# Patient Record
Sex: Female | Born: 1995 | Race: Asian | Hispanic: No | Marital: Single | Smoking: Former smoker
Health system: Southern US, Community
[De-identification: ages and names within clinical notes are randomized; demographics above are authoritative.]

## PROBLEM LIST (undated history)

## (undated) DIAGNOSIS — Z789 Other specified health status: Secondary | ICD-10-CM

## (undated) DIAGNOSIS — F32A Depression, unspecified: Secondary | ICD-10-CM

## (undated) HISTORY — PX: NO PAST SURGERIES: SHX2092

## (undated) HISTORY — DX: Depression, unspecified: F32.A

## (undated) HISTORY — DX: Other specified health status: Z78.9

---

## 2020-10-01 ENCOUNTER — Other Ambulatory Visit: Payer: Self-pay

## 2020-10-01 ENCOUNTER — Ambulatory Visit (INDEPENDENT_AMBULATORY_CARE_PROVIDER_SITE_OTHER): Payer: 59

## 2020-10-01 VITALS — BP 107/52 | HR 83 | Wt 143.0 lb

## 2020-10-01 DIAGNOSIS — Z3201 Encounter for pregnancy test, result positive: Secondary | ICD-10-CM

## 2020-10-01 LAB — POCT URINE PREGNANCY: Preg Test, Ur: NEGATIVE

## 2020-10-01 NOTE — Progress Notes (Addendum)
Pt presents for UPT. UPT positive. Pt will f/u at [facility] appt.  Jasmine Buchanan l Jasmine Buchanan, CMA   Attestation of Attending Supervision of CMA/RN: Evaluation and management procedures were performed by the nurse under my supervision and collaboration.  I have reviewed the nursing note and chart, and I agree with the management and plan.  Carolyn L. Harraway-Smith, M.D., [place]

## 2020-10-15 DIAGNOSIS — Z3401 Encounter for supervision of normal first pregnancy, first trimester: Secondary | ICD-10-CM | POA: Diagnosis not present

## 2020-10-15 DIAGNOSIS — B373 Candidiasis of vulva and vagina: Secondary | ICD-10-CM | POA: Diagnosis not present

## 2020-10-15 DIAGNOSIS — Z23 Encounter for immunization: Secondary | ICD-10-CM | POA: Diagnosis not present

## 2020-10-19 DIAGNOSIS — Z3A13 13 weeks gestation of pregnancy: Secondary | ICD-10-CM | POA: Diagnosis not present

## 2020-10-19 DIAGNOSIS — Z3689 Encounter for other specified antenatal screening: Secondary | ICD-10-CM | POA: Diagnosis not present

## 2020-10-23 ENCOUNTER — Encounter: Payer: Medicaid Other | Admitting: Advanced Practice Midwife

## 2021-01-09 DIAGNOSIS — O365920 Maternal care for other known or suspected poor fetal growth, second trimester, not applicable or unspecified: Secondary | ICD-10-CM | POA: Diagnosis not present

## 2021-01-09 DIAGNOSIS — Z3A25 25 weeks gestation of pregnancy: Secondary | ICD-10-CM | POA: Diagnosis not present

## 2021-02-04 DIAGNOSIS — Z3403 Encounter for supervision of normal first pregnancy, third trimester: Secondary | ICD-10-CM | POA: Diagnosis not present

## 2021-02-04 DIAGNOSIS — Z23 Encounter for immunization: Secondary | ICD-10-CM | POA: Diagnosis not present

## 2021-02-04 DIAGNOSIS — B373 Candidiasis of vulva and vagina: Secondary | ICD-10-CM | POA: Diagnosis not present

## 2021-02-04 DIAGNOSIS — R109 Unspecified abdominal pain: Secondary | ICD-10-CM | POA: Diagnosis not present

## 2021-02-07 DIAGNOSIS — O365930 Maternal care for other known or suspected poor fetal growth, third trimester, not applicable or unspecified: Secondary | ICD-10-CM | POA: Diagnosis not present

## 2021-02-07 DIAGNOSIS — Z3A29 29 weeks gestation of pregnancy: Secondary | ICD-10-CM | POA: Diagnosis not present

## 2021-03-01 DIAGNOSIS — Z3A32 32 weeks gestation of pregnancy: Secondary | ICD-10-CM | POA: Diagnosis not present

## 2021-03-01 DIAGNOSIS — Z3689 Encounter for other specified antenatal screening: Secondary | ICD-10-CM | POA: Diagnosis not present

## 2021-03-01 DIAGNOSIS — O365930 Maternal care for other known or suspected poor fetal growth, third trimester, not applicable or unspecified: Secondary | ICD-10-CM | POA: Diagnosis not present

## 2021-03-08 DIAGNOSIS — Z3A33 33 weeks gestation of pregnancy: Secondary | ICD-10-CM | POA: Diagnosis not present

## 2021-03-08 DIAGNOSIS — O365930 Maternal care for other known or suspected poor fetal growth, third trimester, not applicable or unspecified: Secondary | ICD-10-CM | POA: Diagnosis not present

## 2021-03-08 DIAGNOSIS — Z3689 Encounter for other specified antenatal screening: Secondary | ICD-10-CM | POA: Diagnosis not present

## 2021-03-22 DIAGNOSIS — O26843 Uterine size-date discrepancy, third trimester: Secondary | ICD-10-CM | POA: Diagnosis not present

## 2021-03-22 DIAGNOSIS — Z3A35 35 weeks gestation of pregnancy: Secondary | ICD-10-CM | POA: Diagnosis not present

## 2021-03-29 DIAGNOSIS — Z3A36 36 weeks gestation of pregnancy: Secondary | ICD-10-CM | POA: Diagnosis not present

## 2021-03-29 DIAGNOSIS — O365930 Maternal care for other known or suspected poor fetal growth, third trimester, not applicable or unspecified: Secondary | ICD-10-CM | POA: Diagnosis not present

## 2021-04-14 DIAGNOSIS — Z841 Family history of disorders of kidney and ureter: Secondary | ICD-10-CM | POA: Diagnosis not present

## 2021-04-14 DIAGNOSIS — O365930 Maternal care for other known or suspected poor fetal growth, third trimester, not applicable or unspecified: Secondary | ICD-10-CM | POA: Diagnosis not present

## 2021-04-14 DIAGNOSIS — Z86718 Personal history of other venous thrombosis and embolism: Secondary | ICD-10-CM | POA: Diagnosis not present

## 2021-04-14 DIAGNOSIS — Z803 Family history of malignant neoplasm of breast: Secondary | ICD-10-CM | POA: Diagnosis not present

## 2021-04-14 DIAGNOSIS — Z3A39 39 weeks gestation of pregnancy: Secondary | ICD-10-CM | POA: Diagnosis not present

## 2021-08-05 ENCOUNTER — Encounter: Payer: Self-pay | Admitting: General Practice

## 2021-08-05 ENCOUNTER — Ambulatory Visit (INDEPENDENT_AMBULATORY_CARE_PROVIDER_SITE_OTHER): Payer: 59

## 2021-08-05 ENCOUNTER — Other Ambulatory Visit: Payer: Self-pay

## 2021-08-05 VITALS — BP 107/65 | HR 73 | Ht 63.0 in

## 2021-08-05 DIAGNOSIS — N912 Amenorrhea, unspecified: Secondary | ICD-10-CM | POA: Diagnosis not present

## 2021-08-05 LAB — POCT URINE PREGNANCY: Preg Test, Ur: POSITIVE — AB

## 2021-08-05 NOTE — Progress Notes (Signed)
Patient states she had a baby three months ago. Patient took pregnancy test at home and got positive result. Patient wanting pregnancy test here to confirm. Armandina Stammer RN

## 2021-09-03 ENCOUNTER — Encounter: Payer: 59 | Admitting: Advanced Practice Midwife

## 2021-09-22 NOTE — L&D Delivery Note (Signed)
OB/GYN Faculty Practice Delivery Note  Mali Eppard is a 26 y.o. G3P1011 s/p SVD at [redacted]w[redacted]d. She was admitted for Spontaneous labor.   ROM: 6h 68m with clear fluid GBS Status: neg Maximum Maternal Temperature: 98.9  Labor Progress: Presented for labor and was initially expectantly managed. She was AROMed and then started on pitocin and progressed to complete.  Delivery Date/Time: 2041 on 7/8 Delivery: Called to room and patient was complete and pushing. Head delivered OA. Tight nuchal cord present and delivered through and then reduced. Shoulder and body delivered in usual fashion. Infant initially stunned without  spontaneous cry and therefore cord clamped and cut immediately by father of baby under my direct supervision and taken to warmer. Infant eventually stimulated and brought back to mother.  Cord blood drawn. Placenta delivered spontaneously with gentle cord traction. Fundus firm with massage and Pitocin. Labia, perineum, vagina, and cervix and there were no lacerations  Placenta: intact, 3V cord, to L&D Complications: none Lacerations: none EBL: 200cc Analgesia: epidural    Infant: female  APGARs 7,9  weight pending  Warner Mccreedy, MD, MPH OB Fellow, Faculty Practice

## 2021-10-01 ENCOUNTER — Ambulatory Visit (INDEPENDENT_AMBULATORY_CARE_PROVIDER_SITE_OTHER): Payer: Medicaid Other

## 2021-10-01 ENCOUNTER — Other Ambulatory Visit: Payer: Self-pay

## 2021-10-01 ENCOUNTER — Other Ambulatory Visit (HOSPITAL_COMMUNITY)
Admission: RE | Admit: 2021-10-01 | Discharge: 2021-10-01 | Disposition: A | Discharge status: Home / Self Care | Payer: Medicaid Other | Source: Ambulatory Visit

## 2021-10-01 ENCOUNTER — Encounter: Payer: Self-pay | Admitting: General Practice

## 2021-10-01 VITALS — BP 101/67 | HR 78 | Wt 164.0 lb

## 2021-10-01 DIAGNOSIS — Z3490 Encounter for supervision of normal pregnancy, unspecified, unspecified trimester: Secondary | ICD-10-CM | POA: Insufficient documentation

## 2021-10-01 DIAGNOSIS — O09899 Supervision of other high risk pregnancies, unspecified trimester: Secondary | ICD-10-CM | POA: Insufficient documentation

## 2021-10-01 DIAGNOSIS — Z3A14 14 weeks gestation of pregnancy: Secondary | ICD-10-CM | POA: Diagnosis not present

## 2021-10-01 DIAGNOSIS — O09892 Supervision of other high risk pregnancies, second trimester: Secondary | ICD-10-CM

## 2021-10-01 DIAGNOSIS — N898 Other specified noninflammatory disorders of vagina: Secondary | ICD-10-CM

## 2021-10-01 DIAGNOSIS — Z8759 Personal history of other complications of pregnancy, childbirth and the puerperium: Secondary | ICD-10-CM | POA: Diagnosis not present

## 2021-10-01 DIAGNOSIS — O0990 Supervision of high risk pregnancy, unspecified, unspecified trimester: Secondary | ICD-10-CM | POA: Insufficient documentation

## 2021-10-01 DIAGNOSIS — Z31430 Encounter of female for testing for genetic disease carrier status for procreative management: Secondary | ICD-10-CM | POA: Diagnosis not present

## 2021-10-01 NOTE — Progress Notes (Signed)
Subjective:   Jasmine Buchanan is a 26 y.o. G3P1001 at [redacted]w[redacted]d by Definite LMP being seen today for her first obstetrical visit.  Patient states this was an unplanned pregnancy.  Patient reports she was not on birth control prior to conception. However, she has used Depo in the past, but was unhappy with feelings of depression and weight gain therefore discontinuing method.   Gynecological/Obstetrical History: Patient reports a history of gynecological surgeries.  Patient denies history of abnormal pap smears. She reports last pap smear was completed when she was 23.   Pregnancy history fully reviewed. Patient does not intend to breast feed. Patient obstetrical history is significant for  short interval as last delivery was in July 2022, IUGR in previous pregnancy . Previous PN records available in [place].    Sexual Activity and Vaginal Concerns: Patient is currently sexually active and reports some consistent pain that she describes as "uncomfortable and dry."  She also denies vaginal discharge, bleeding, irritation, or odor. Patient also denies pain or difficulty with urination.    Medical History/ROS: Patient denies medical history significant for cardiovascular, respiratory, gastrointestinal, or hematological disorders. Patient also denies history of MH disorders including anxiety.  She states depression resolved after depo discontinued. Patient reports no complaints.  Patient denies constipation/diarrhea or nausea/vomiting.  No recurrent headaches.   Social History: Patient reports history of occasional alcohol usage.  She denies history or current usage of tobacco or drugs.  Patient reports the FOB is Beverely Low who is involved, supportive, and not present d/t infant care.  Patient reports that she lives with Beverely Low and their daughter and endorses safety at home.  Patient denies DV/A. Patient is not currently employed or attending school.  HISTORY: OB History  Gravida Para Term Preterm AB  Living  3 1 1  0 0 1  SAB IAB Ectopic Multiple Live Births  0 0 0 0 1    # Outcome Date GA Lbr Len/2nd Weight Sex Delivery Anes PTL Lv  3 Current           2 Term 04/15/21 [redacted]w[redacted]d  6 lb (2.722 kg) F Vag-Spont EPI N LIV  1 Gravida             Last pap smear was done in 2021 and was normal. Patient declines repeat today and opts to sign ROI to obtain records.   No past medical history on file.  The histories are not reviewed yet. Please review them in the "History" navigator section and refresh this Loa. Family History  Problem Relation Age of Onset   Kidney disease Father    Social History   Tobacco Use   Smoking status: Every Day    Types: Cigarettes   Smokeless tobacco: Never  Substance Use Topics   Alcohol use: Not Currently   No Known Allergies Current Outpatient Medications on File Prior to Visit  Medication Sig Dispense Refill   Prenatal Vit-Fe Fumarate-FA (PRENATAL VITAMINS PO) Take by mouth.     No current facility-administered medications on file prior to visit.    Review of Systems Pertinent items noted in HPI and remainder of comprehensive ROS otherwise negative.  Exam   Vitals:   10/01/21 0829  BP: 101/67  Pulse: 78  Weight: 164 lb (74.4 kg)   Fetal Heart Rate (bpm): 143  Physical Exam Constitutional:      Appearance: Normal appearance.  Genitourinary:     Vaginal discharge present.     Vaginal exam comments: Moderate amt thick  white discharge noted.  CV collected..     Cervical discharge present.     No cervical motion tenderness, friability, polyp or nabothian cyst.     Cervical exam comments: Whitish yellow mucoid/creamy discharge from cervix. Marland Kitchen     Uterus is enlarged.     Uterus is anteverted.  HENT:     Head: Normocephalic and atraumatic.  Eyes:     Conjunctiva/sclera: Conjunctivae normal.  Cardiovascular:     Rate and Rhythm: Normal rate and regular rhythm.     Heart sounds: Normal heart sounds.  Pulmonary:     Effort: Pulmonary  effort is normal. No respiratory distress.     Breath sounds: Normal breath sounds.  Musculoskeletal:        General: Normal range of motion.     Cervical back: Normal range of motion.  Neurological:     Mental Status: She is alert and oriented to person, place, and time.  Skin:    General: Skin is warm and dry.  Psychiatric:        Mood and Affect: Mood normal.        Behavior: Behavior normal.        Thought Content: Thought content normal.  Vitals reviewed. Exam conducted with a chaperone present.    Assessment:   26 y.o. year old G3P1001 Patient Active Problem List   Diagnosis Date Noted   Supervision of high risk pregnancy, antepartum 10/01/2021   Short interval between pregnancies affecting pregnancy, antepartum 10/01/2021   History of prior pregnancy with IUGR newborn 10/01/2021     Plan:  1. Encounter for supervision of high-risk pregnancy, antepartum -Congratulations given and patient welcomed to practice. -Discussed usage of Babyscripts and virtual visits as additional source of managing and completing PN visits.   *Instructed to take blood pressure and record weekly into babyscripts. *Reviewed prenatal visit schedule and platforms used for virtual visits.  -Anticipatory guidance for prenatal visits including labs, ultrasounds, and testing; Initial labs drawn. -Genetic Screening discussed, First trimester screen: ordered. -Encouraged to complete and utilize MyChart Registration for her ability to review results, send requests, and have questions addressed.  -Discussed estimated due date of March 31, 2022. -Ultrasound discussed; fetal anatomic survey: ordered. -Continue prenatal vitamins  -Influenza offered and accepted. -Encouraged to seek out care at office or emergency room for urgent and/or emergent concerns. -Educated on the nature of [place] with multiple MDs and other Advanced Practice Providers was explained to patient;  also emphasized that residents, students are part of our team. Informed of her right to refuse care as she deems appropriate.  -No questions or concerns.    2. Short interval between pregnancies affecting pregnancy, antepartum -Last delivery July 2022  3. [redacted] weeks gestation of pregnancy -Doing well overall -Patient expresses desire to avoid learners as primary caregiver during labor process. -Informed that this will be added to chart.   4. Vaginal irritation -CV collected. -Will treat accordingly.  5. History of prior pregnancy with IUGR newborn -Previous infant 6lbs at 31 weeks -Induced and pregnancy complicated by IUGR.      Problem list reviewed and updated. Routine obstetric precautions reviewed.  No orders of the defined types were placed in this encounter.   No follow-ups on file.     Maryann Conners, CNM 10/01/2021 9:06 AM

## 2021-10-02 LAB — CBC/D/PLT+RPR+RH+ABO+RUBIGG...
Antibody Screen: NEGATIVE
Basophils Absolute: 0 10*3/uL (ref 0.0–0.2)
Basos: 0 %
EOS (ABSOLUTE): 0.1 10*3/uL (ref 0.0–0.4)
Eos: 1 %
HCV Ab: <0.1 s/co ratio (ref 0.0–0.9)
HIV Screen 4th Generation wRfx: NONREACTIVE
Hematocrit: 34.9 % (ref 34.0–46.6)
Hemoglobin: 12.3 g/dL (ref 11.1–15.9)
Hepatitis B Surface Ag: NEGATIVE
Immature Grans (Abs): 0 10*3/uL (ref 0.0–0.1)
Immature Granulocytes: 0 %
Lymphocytes Absolute: 2.4 10*3/uL (ref 0.7–3.1)
Lymphs: 25 %
MCH: 32.5 pg (ref 26.6–33.0)
MCHC: 35.2 g/dL (ref 31.5–35.7)
MCV: 92 fL (ref 79–97)
Monocytes Absolute: 0.8 10*3/uL (ref 0.1–0.9)
Monocytes: 8 %
Neutrophils Absolute: 6.2 10*3/uL (ref 1.4–7.0)
Neutrophils: 66 %
Platelets: 298 10*3/uL (ref 150–450)
RBC: 3.79 x10E6/uL (ref 3.77–5.28)
RDW: 11.9 % (ref 11.7–15.4)
RPR Ser Ql: NONREACTIVE
Rh Factor: POSITIVE
Rubella Antibodies, IGG: 2.07 index (ref >0.99)
WBC: 9.6 10*3/uL (ref 3.4–10.8)

## 2021-10-02 LAB — CERVICOVAGINAL ANCILLARY ONLY
Bacterial Vaginitis (gardnerella): NEGATIVE
Candida Glabrata: NEGATIVE
Candida Vaginitis: POSITIVE — AB
Chlamydia: NEGATIVE
Comment: NEGATIVE
Comment: NEGATIVE
Comment: NEGATIVE
Comment: NEGATIVE
Comment: NEGATIVE
Comment: NORMAL
Neisseria Gonorrhea: NEGATIVE
Trichomonas: NEGATIVE

## 2021-10-02 LAB — HCV INTERPRETATION

## 2021-10-03 MED ORDER — TERCONAZOLE 0.4 % VA CREA: 1.0000 | TOPICAL_CREAM | Freq: Every day | VAGINAL | 0 refills | Status: DC

## 2021-10-03 NOTE — Addendum Note (Signed)
Addended by: Gerrit Heck L on: 10/03/2021 08:29 PM   Modules accepted: Orders

## 2021-10-04 LAB — CULTURE, OB URINE

## 2021-10-04 LAB — URINE CULTURE, OB REFLEX

## 2021-10-10 ENCOUNTER — Other Ambulatory Visit: Payer: Self-pay

## 2021-10-10 DIAGNOSIS — B3731 Acute candidiasis of vulva and vagina: Secondary | ICD-10-CM

## 2021-10-10 MED ORDER — FLUCONAZOLE 150 MG PO TABS: 150.0000 mg | ORAL_TABLET | Freq: Every day | ORAL | 0 refills | Status: DC

## 2021-10-19 ENCOUNTER — Other Ambulatory Visit: Payer: Self-pay

## 2021-10-21 ENCOUNTER — Other Ambulatory Visit (HOSPITAL_COMMUNITY): Payer: Self-pay

## 2021-10-21 MED ORDER — TERCONAZOLE 0.4 % VA CREA
1.0000 | TOPICAL_CREAM | Freq: Every day | VAGINAL | 0 refills | Status: DC
  Filled 2021-10-21: qty 45, 7d supply, fill #0

## 2021-10-31 ENCOUNTER — Telehealth (INDEPENDENT_AMBULATORY_CARE_PROVIDER_SITE_OTHER): Payer: Medicaid Other | Admitting: Family Medicine

## 2021-10-31 DIAGNOSIS — O09892 Supervision of other high risk pregnancies, second trimester: Secondary | ICD-10-CM

## 2021-10-31 DIAGNOSIS — Z3A18 18 weeks gestation of pregnancy: Secondary | ICD-10-CM

## 2021-10-31 DIAGNOSIS — O0992 Supervision of high risk pregnancy, unspecified, second trimester: Secondary | ICD-10-CM

## 2021-10-31 DIAGNOSIS — O0990 Supervision of high risk pregnancy, unspecified, unspecified trimester: Secondary | ICD-10-CM

## 2021-10-31 DIAGNOSIS — O09899 Supervision of other high risk pregnancies, unspecified trimester: Secondary | ICD-10-CM

## 2021-10-31 NOTE — Progress Notes (Signed)
° °  OBSTETRICS PRENATAL VIRTUAL VISIT ENCOUNTER NOTE  Provider location: [facility]   Patient location: Home  I connected with Jasmine Buchanan on 10/31/21 at  1:10 PM EST by MyChart Video Encounter and verified that I am speaking with the correct person using two identifiers. I discussed the limitations, risks, security and privacy concerns of performing an evaluation and management service virtually and the availability of in person appointments. I also discussed with the patient that there may be a patient responsible charge related to this service. The patient expressed understanding and agreed to proceed. Subjective:  Jasmine Buchanan is a 26 y.o. G3P1001 at [redacted]w[redacted]d being seen today for ongoing prenatal care.  She is currently monitored for the following issues for this low-risk pregnancy and has Supervision of high risk pregnancy, antepartum; Short interval between pregnancies affecting pregnancy, antepartum; and History of prior pregnancy with IUGR newborn on their problem list.  Patient reports no complaints.  Contractions: Not present. Vag. Bleeding: None.  Movement: Absent. Denies any leaking of fluid.   The following portions of the patient's history were reviewed and updated as appropriate: allergies, current medications, past family history, past medical history, past social history, past surgical history and problem list.   Objective:  There were no vitals filed for this visit.  Fetal Status:     Movement: Absent     General:  Alert, oriented and cooperative. Patient is in no acute distress.  Respiratory: Normal respiratory effort, no problems with respiration noted  Mental Status: Normal mood and affect. Normal behavior. Normal judgment and thought content.  Rest of physical exam deferred due to type of encounter  Imaging: No results found.  Assessment and Plan:  Pregnancy: G3P1001 at [redacted]w[redacted]d 1. Supervision of high risk pregnancy,  antepartum Good fetal movement. Tylenol, hydration for headaches  2. Short interval between pregnancies affecting pregnancy, antepartum   Preterm labor symptoms and general obstetric precautions including but not limited to vaginal bleeding, contractions, leaking of fluid and fetal movement were reviewed in detail with the patient. I discussed the assessment and treatment plan with the patient. The patient was provided an opportunity to ask questions and all were answered. The patient agreed with the plan and demonstrated an understanding of the instructions. The patient was advised to call back or seek an in-person office evaluation/go to [facility] at [facility] for any urgent or concerning symptoms. Please refer to After Visit Summary for other counseling recommendations.   I provided 8 minutes of face-to-face time during this encounter.  No follow-ups on file.  Future Appointments  Date Time Provider [place]  11/04/2021  1:30 PM [facility] NURSE [facility]  11/04/2021  1:45 PM [facility] US5 [facility]  11/25/2021 11:15 AM Radene Gunning, MD [facility] None    [place] for Jasmine Buchanan, [place]

## 2021-11-04 ENCOUNTER — Encounter: Payer: Self-pay | Admitting: *Deleted

## 2021-11-04 ENCOUNTER — Other Ambulatory Visit: Payer: Self-pay | Admitting: *Deleted

## 2021-11-04 ENCOUNTER — Ambulatory Visit: Payer: Medicaid Other

## 2021-11-04 ENCOUNTER — Ambulatory Visit: Payer: Medicaid Other | Admitting: *Deleted

## 2021-11-04 ENCOUNTER — Other Ambulatory Visit: Payer: Self-pay

## 2021-11-04 VITALS — BP 115/62 | HR 65

## 2021-11-04 DIAGNOSIS — Z3490 Encounter for supervision of normal pregnancy, unspecified, unspecified trimester: Secondary | ICD-10-CM

## 2021-11-04 DIAGNOSIS — Z363 Encounter for antenatal screening for malformations: Secondary | ICD-10-CM | POA: Insufficient documentation

## 2021-11-04 DIAGNOSIS — Z3A19 19 weeks gestation of pregnancy: Secondary | ICD-10-CM | POA: Diagnosis not present

## 2021-11-04 DIAGNOSIS — O4442 Low lying placenta NOS or without hemorrhage, second trimester: Secondary | ICD-10-CM

## 2021-11-04 DIAGNOSIS — Z3A14 14 weeks gestation of pregnancy: Secondary | ICD-10-CM

## 2021-11-04 DIAGNOSIS — Z8759 Personal history of other complications of pregnancy, childbirth and the puerperium: Secondary | ICD-10-CM

## 2021-11-04 DIAGNOSIS — O09899 Supervision of other high risk pregnancies, unspecified trimester: Secondary | ICD-10-CM

## 2021-11-04 DIAGNOSIS — O09292 Supervision of pregnancy with other poor reproductive or obstetric history, second trimester: Secondary | ICD-10-CM | POA: Insufficient documentation

## 2021-11-04 DIAGNOSIS — O09892 Supervision of other high risk pregnancies, second trimester: Secondary | ICD-10-CM | POA: Insufficient documentation

## 2021-11-04 IMAGING — US US MFM OB DETAIL+14 WK
1 series · 13 of 28 positions shown · non-contrast
Comparison: none

[Series 1: us mfm ob detail+14 wk · 110 acquisitions, 13 frames shown]
[im 5/110]
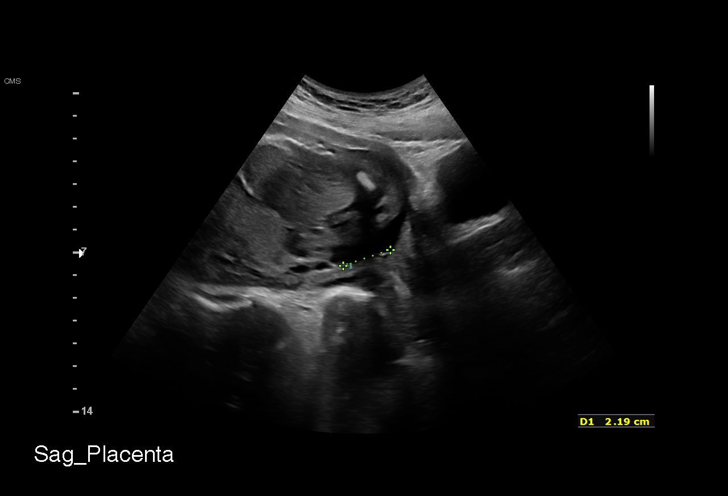
[im 13/110]
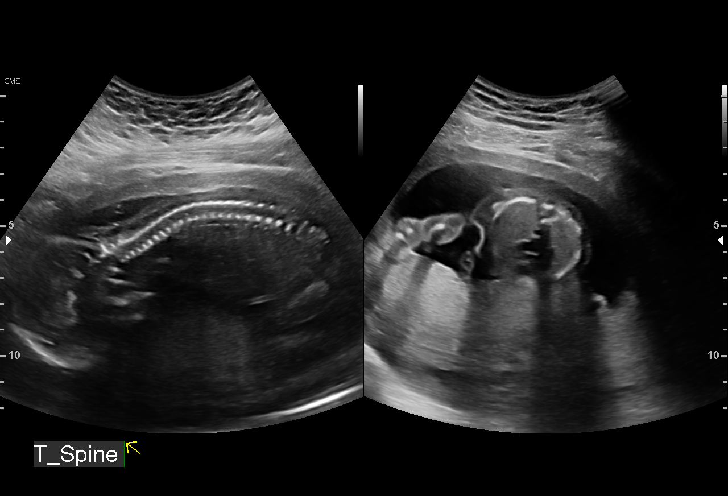
[im 21/110]
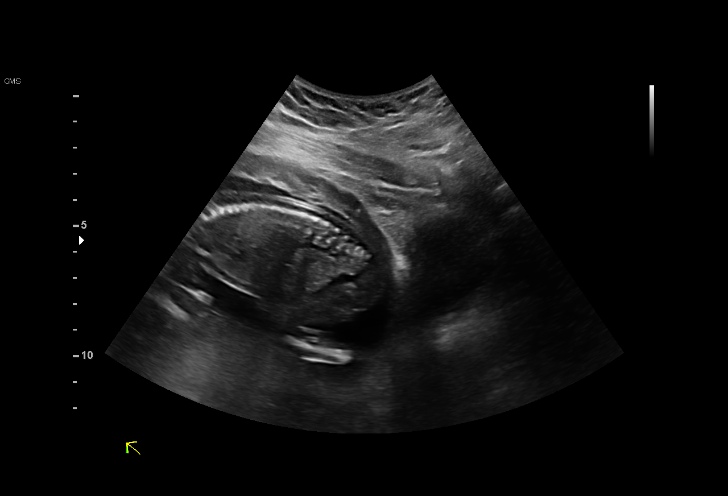
[im 29/110]
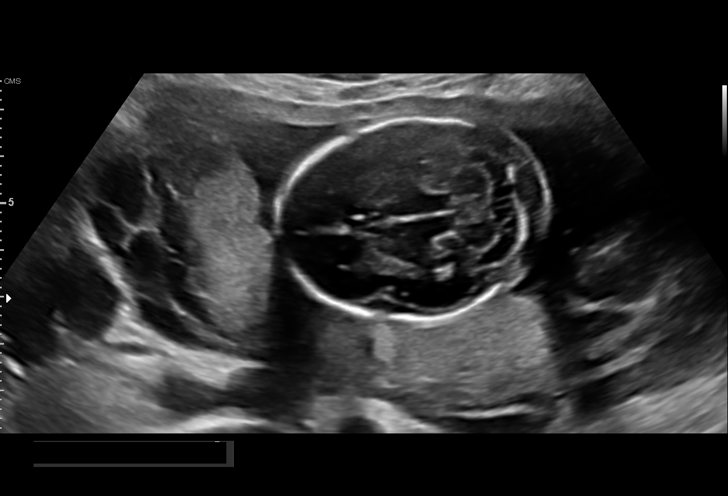
[im 37/110]
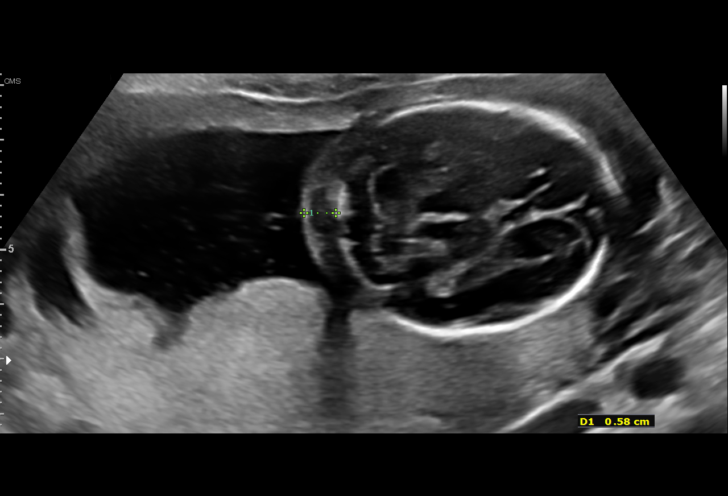
[im 45/110]
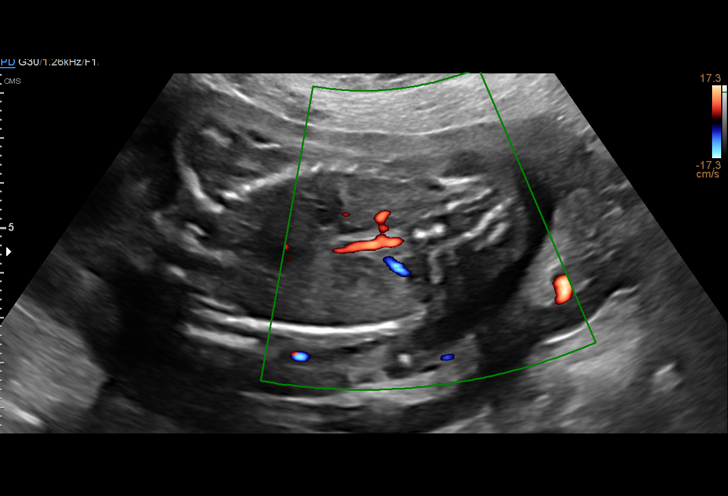
[im 57/110]
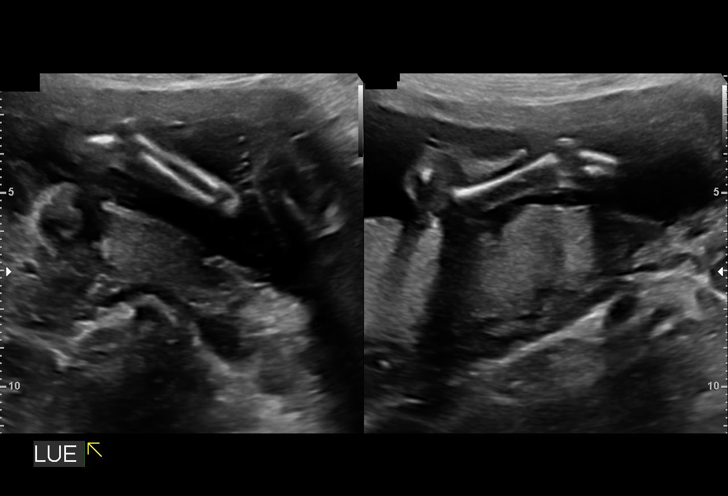
[im 65/110]
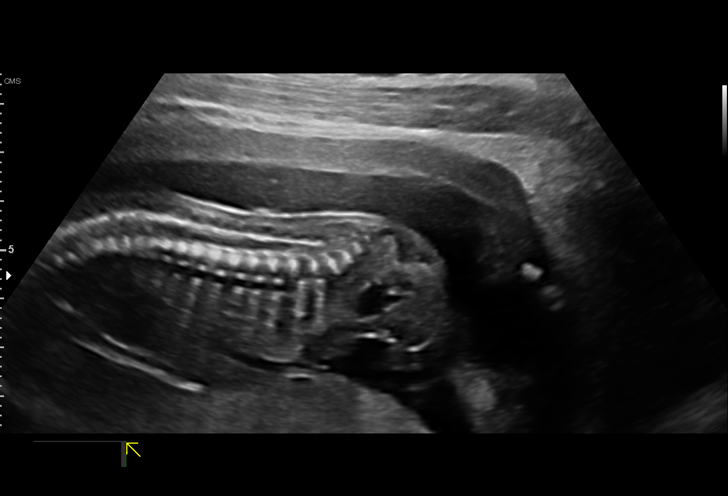
[im 73/110]
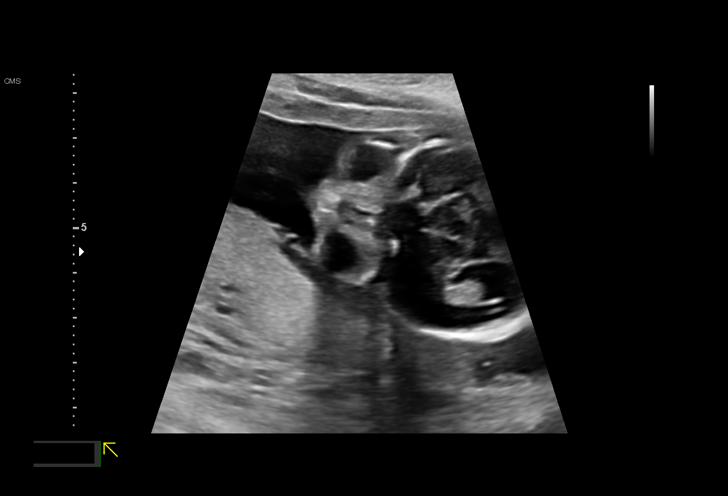
[im 81/110]
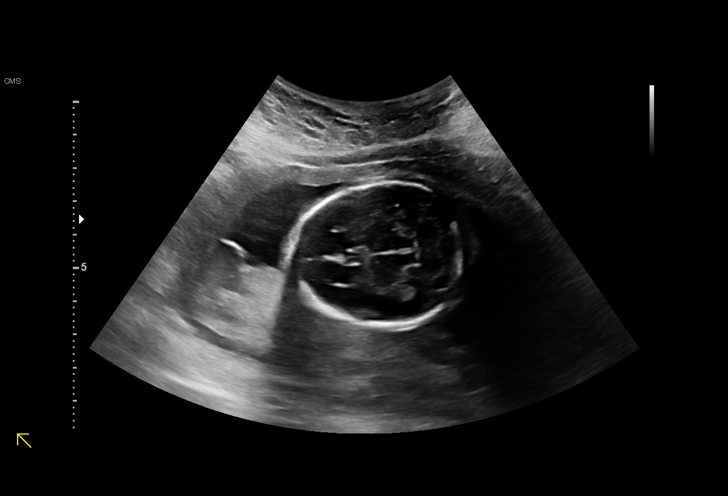
[im 89/110]
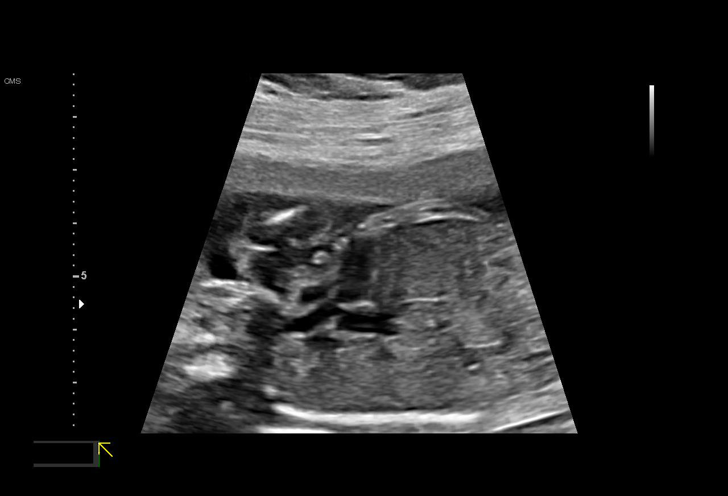
[im 97/110]
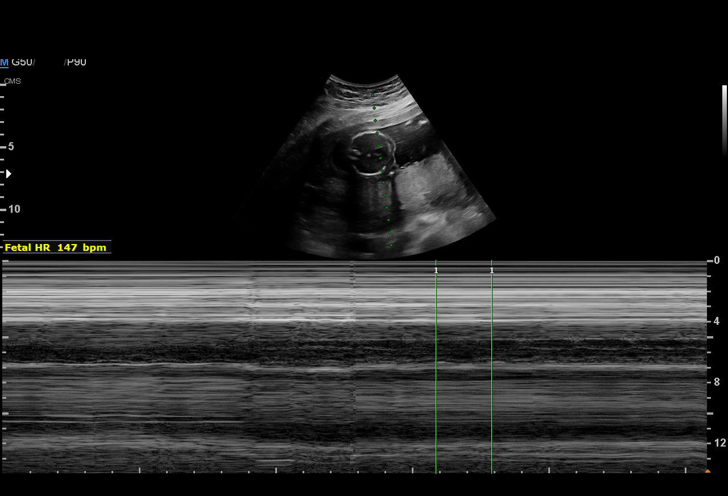
[im 105/110]
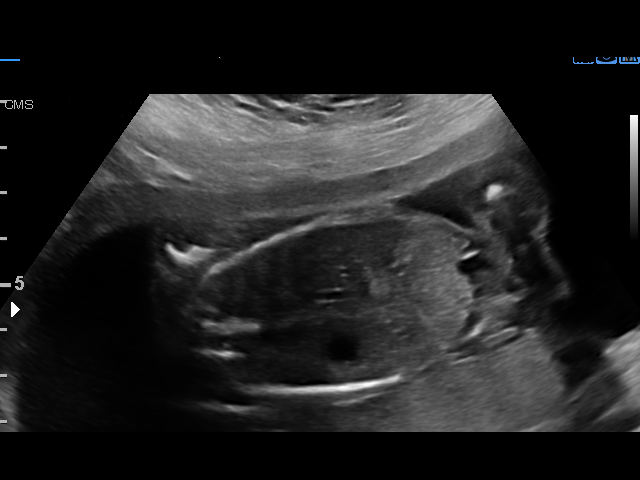

[13 of 28 positions shown; findings below may reference images not displayed]

Indications

 Echogenic intracardiac focus of the heart      [H3]
 (EIF)
 Low lying placenta, antepartum                 [H3]
 19 weeks gestation of pregnancy
 Low risk NIPS, neg [place]
 Antenatal screening for malformations          [H3]
 Short interval between pregancies, 2nd         [H3]
 trimester (delivery [DATE])
 Poor obstetric history: Previous fetal growth  [H3]
 restriction (FGR)
Fetal Evaluation

 Num Of Fetuses:         1
 Fetal Heart Rate(bpm):  147
 Cardiac Activity:       Observed
 Presentation:           Breech
 Placenta:               Posterior, low-lying, 1.6 cm from int os
 P. Cord Insertion:      Visualized

 Amniotic Fluid
 AFI FV:      Within normal limits

                             Largest Pocket(cm)

Biometry

 BPD:      41.7  mm     G. Age:  18w 4d         34  %    CI:        69.55   %    70 - 86
                                                         FL/HC:      18.0   %    16.1 -
 HC:      159.6  mm     G. Age:  18w 6d         33  %    HC/AC:      1.20        1.09 -
 AC:      133.3  mm     G. Age:  18w 6d         39  %    FL/BPD:     68.8   %
 FL:       28.7  mm     G. Age:  18w 6d         36  %    FL/AC:      21.5   %    20 - 24
 HUM:      29.1  mm     G. Age:  19w 3d         62  %
 CER:      19.6  mm     G. Age:  19w 0d         36  %
 NFT:       5.8  mm
 LV:        6.1  mm
 CM:        4.5  mm

 Est. FW:     259  gm      0 lb 9 oz     35  %
OB History

 Gravidity:    2         Term:   1        Prem:   0        SAB:   0
 TOP:          0       Ectopic:  0        Living: 1
Gestational Age

 LMP:           19w 0d        Date:  [DATE]                 EDD:   [DATE]
 U/S Today:     18w 6d                                        EDD:   [DATE]
 Best:          19w 0d     Det. By:  LMP  ([DATE])          EDD:   [DATE]
Anatomy

 Cranium:               Appears normal         Aortic Arch:            Appears normal
 Cavum:                 Appears normal         Ductal Arch:            Not well visualized
 Ventricles:            Appears normal         Diaphragm:              Appears normal
 Choroid Plexus:        Appears normal         Stomach:                Appears normal, left
                                                                       sided
 Cerebellum:            Appears normal         Abdomen:                Appears normal
 Posterior Fossa:       Appears normal         Abdominal Wall:         Appears nml (cord
                                                                       insert, abd wall)
 Nuchal Fold:           Appears normal         Cord Vessels:           Appears normal (3
                                                                       vessel cord)
 Face:                  Appears normal         Kidneys:                Appear normal
                        (orbits and profile)
 Lips:                  Appears normal         Bladder:                Appears normal
 Thoracic:              Appears normal         Spine:                  Appears normal
 Heart:                 Not well visualized;   Upper Extremities:      Appears normal
                        EIF
 RVOT:                  Appears normal         Lower Extremities:      Appears normal
 LVOT:                  Appears normal

 Other:  VC, 3VV, heels/feet, open hands/5th digits, nasal bone, lenses,
         maxilla, mandible
Cervix Uterus Adnexa

 Cervix
 Length:           3.18  cm.
 Normal appearance by transabdominal scan.

 Uterus
 No abnormality visualized.

 Right Ovary
 Not visualized.
 Left Ovary
 Not visualized.

 Cul De Sac
 No free fluid seen.

 Adnexa
 No abnormality visualized.
Impression

 G3 P1.  Patient is here for fetal anatomy scan.
 On cell-free fetal DNA screening, the risks of fetal
 aneuploidies are not increased .
 Obstetric history significant for a term vaginal delivery in [DATE] of a female infant weighing 6 pounds 2 ounces at birth.

 We performed a fetal anatomical survey.  An echogenic
 intracardiac focus is seen.  No other markers of aneuploidies
 or fetal structural defects are seen.  Fetal biometry is
 consistent with her previously established dates.  Amniotic
 fluid is normal and good fetal activity seen.

 Placenta is low-lying.

 I explained the finding of echogenic intracardiac focus that is
 seen in about 15%  of Asian population (patient is of Asian).
 It is a marker for Down syndrome.  Given that she had low
 risk for Down syndrome on cell free fetal DNA screening, this
 should be considered a normal variant.  I do not recommend
 amniocentesis for this finding.

 I reassured the patient that low-lying placenta usually
 resolves with advancing gestation.
Recommendations

 -An appointment was made for her to return in 4 weeks for
 completion of fetal anatomy.
                 [place]

## 2021-11-05 DIAGNOSIS — O4442 Low lying placenta NOS or without hemorrhage, second trimester: Secondary | ICD-10-CM | POA: Insufficient documentation

## 2021-11-05 DIAGNOSIS — O283 Abnormal ultrasonic finding on antenatal screening of mother: Secondary | ICD-10-CM | POA: Insufficient documentation

## 2021-11-24 NOTE — Progress Notes (Deleted)
? ?  PRENATAL VISIT NOTE ? ?Subjective:  ?Jasmine Buchanan is a 26 y.o. G3P1011 at [redacted]w[redacted]d being seen today for ongoing prenatal care.  She is currently monitored for the following issues for this high-risk pregnancy and has Supervision of high risk pregnancy, antepartum; Short interval between pregnancies affecting pregnancy, antepartum; History of prior pregnancy with IUGR newborn; Low-lying placenta in second trimester; and Fetal echogenic intracardiac focus on prenatal ultrasound on their problem list. ? ?Patient reports {[id number]}.   .  .   . Denies leaking of fluid.  ? ?The following portions of the patient's history were reviewed and updated as appropriate: allergies, current medications, past family history, past medical history, past social history, past surgical history and problem list.  ? ?Objective:  ?There were no vitals filed for this visit. ? ?Fetal Status:          ? ?General:  Alert, oriented and cooperative. Patient is in no acute distress.  ?Skin: Skin is warm and dry. No rash noted.   ?Cardiovascular: Normal heart rate noted  ?Respiratory: Normal respiratory effort, no problems with respiration noted  ?Abdomen: Soft, gravid, appropriate for gestational age.        ?Pelvic: Cervical exam deferred        ?Extremities: Normal range of motion.     ?Mental Status: Normal mood and affect. Normal behavior. Normal judgment and thought content.  ? ?Assessment and Plan:  ?Pregnancy: G3P1011 at [redacted]w[redacted]d ?1. Supervision of high risk pregnancy, antepartum ?- Discussed flu shot - pt *** ?- Discussed MOF - plans to *** ? ?2. Short interval between pregnancies affecting pregnancy, antepartum ?- Last delivery was July 2022 ? ?3. History of prior pregnancy with IUGR newborn ?- Would not consider the baby IUGR - was 2722g at delivery at 18w ?- Still we will monitor in third trimester given short interval pregnancy ? ?4. Low-lying placenta in second trimester ?- Will f/u on 3/13 ? ?5. Fetal echogenic intracardiac focus on  prenatal ultrasound ?- LR NIPS, female ? ?Preterm labor symptoms and general obstetric precautions including but not limited to vaginal bleeding, contractions, leaking of fluid and fetal movement were reviewed in detail with the patient. ?Please refer to After Visit Summary for other counseling recommendations.  ? ?No follow-ups on file. ? ?Future Appointments  ?Date Time Provider [place]  ?11/25/2021 11:15 AM Radene Gunning, MD [facility] None  ?12/02/2021  3:15 PM [facility] NURSE [facility]  ?12/02/2021  3:30 PM [facility] US3 [facility] [place]  ?12/23/2021  8:35 AM Constant, Vickii Chafe, MD [facility] None  ?01/10/2022 11:15 AM Truett Mainland, DO [facility] None  ? ? ?Radene Gunning, MD ?

## 2021-11-25 ENCOUNTER — Encounter: Payer: Medicaid Other | Admitting: Obstetrics and Gynecology

## 2021-11-25 DIAGNOSIS — O09899 Supervision of other high risk pregnancies, unspecified trimester: Secondary | ICD-10-CM

## 2021-11-25 DIAGNOSIS — O4442 Low lying placenta NOS or without hemorrhage, second trimester: Secondary | ICD-10-CM

## 2021-11-25 DIAGNOSIS — O0990 Supervision of high risk pregnancy, unspecified, unspecified trimester: Secondary | ICD-10-CM

## 2021-11-25 DIAGNOSIS — Z8759 Personal history of other complications of pregnancy, childbirth and the puerperium: Secondary | ICD-10-CM

## 2021-11-25 DIAGNOSIS — O283 Abnormal ultrasonic finding on antenatal screening of mother: Secondary | ICD-10-CM

## 2021-11-26 ENCOUNTER — Telehealth: Payer: Self-pay

## 2021-11-28 ENCOUNTER — Encounter: Payer: Medicaid Other | Admitting: Family Medicine

## 2021-12-02 ENCOUNTER — Ambulatory Visit: Payer: Medicaid Other

## 2021-12-03 ENCOUNTER — Ambulatory Visit (INDEPENDENT_AMBULATORY_CARE_PROVIDER_SITE_OTHER): Payer: Medicaid Other

## 2021-12-03 ENCOUNTER — Other Ambulatory Visit: Payer: Self-pay

## 2021-12-03 VITALS — BP 121/74 | HR 91 | Wt 178.0 lb

## 2021-12-03 DIAGNOSIS — Z8759 Personal history of other complications of pregnancy, childbirth and the puerperium: Secondary | ICD-10-CM

## 2021-12-03 DIAGNOSIS — O0992 Supervision of high risk pregnancy, unspecified, second trimester: Secondary | ICD-10-CM

## 2021-12-03 DIAGNOSIS — O0990 Supervision of high risk pregnancy, unspecified, unspecified trimester: Secondary | ICD-10-CM

## 2021-12-03 DIAGNOSIS — O99212 Obesity complicating pregnancy, second trimester: Secondary | ICD-10-CM

## 2021-12-03 DIAGNOSIS — Z3A23 23 weeks gestation of pregnancy: Secondary | ICD-10-CM

## 2021-12-03 MED ORDER — ASPIRIN EC 81 MG PO TBEC: 81.0000 mg | DELAYED_RELEASE_TABLET | Freq: Every day | ORAL | 2 refills | Status: DC

## 2021-12-03 NOTE — Progress Notes (Signed)
? ?HIGH-RISK PREGNANCY OFFICE VISIT ? ?Patient name: Jasmine Buchanan MRN [id number]  Date of birth: 1996/09/14 ?Chief Complaint:   ?Routine Prenatal Visit ? ?Subjective:   ?Jasmine Buchanan is a 26 y.o. G38P1011 female at [redacted]w[redacted]d with an Estimated Date of Delivery: 03/31/22 being seen today for ongoing management of a high-risk pregnancy aeb has Supervision of high risk pregnancy, antepartum; Short interval between pregnancies affecting pregnancy, antepartum; History of prior pregnancy with IUGR newborn; Low-lying placenta in second trimester; and Fetal echogenic intracardiac focus on prenatal ultrasound on their problem list. ? ?Patient presents today, alone, with no complaints and reports "everything has been going well so far."  Patient endorses fetal movement. Patient denies abdominal cramping or contractions.  Patient denies vaginal concerns including abnormal discharge, leaking of fluid, and bleeding. Patient denies issues with urination, constipation, or diarrhea.  ? ? Contractions: Not present. Vag. Bleeding: None.  Movement: Present. ? ?Reviewed past medical,surgical, social, obstetrical and family history as well as problem list, medications and allergies. ? ?Objective  ? ?Vitals:  ? 12/03/21 0856  ?BP: 121/74  ?Pulse: 91  ?Weight: 178 lb (80.7 kg)  ?Body mass index is 31.53 kg/m?.  ?Total Weight Gain:18 lb (8.165 kg) ? ?  ?     Physical Examination:  ? General appearance: Well appearing, and in no distress ? Mental status: Alert, oriented to person, place, and time ? Skin: Warm & dry ? Cardiovascular: Normal heart rate noted ? Respiratory: Normal respiratory effort, no distress ? Abdomen: Soft, gravid, nontender, AGA with fundus +3/U. ? Pelvic: Cervical exam deferred          ? Extremities: Edema: None ? ?Fetal Status: Fetal Heart Rate (bpm): 140  Movement: Present  ? ?No results found for this or any previous visit (from the past 24 hour(s)).  ?Assessment & Plan:  ?High-risk pregnancy of a 26 y.o., G3P1011 at  [redacted]w[redacted]d with an Estimated Date of Delivery: 03/31/22  ? ?1. Supervision of high risk pregnancy, antepartum ?-Anticipatory guidance for upcoming appts. ?-Patient to schedule next appt in 4-5 weeks for an in-person visit. ?-Reviewed Glucola appt preparation including fasting the night before and morning of.   ?*Informed that it is okay to drink plain water throughout the night and prior to consumption of Glucola formula.  ?-Discussed anticipated office time of 2.5-3 hours.  ?-Reviewed blood draw procedures and labs which also include check of iron/HgB level, RPR, and HIV ?*Informed that repeat RPR/HIV are for pediatric records/compliance.  ?-Discussed how results of GTT are handled including diabetic education and BS testing for abnormal results and routine care for normal results.  ? ?2. [redacted] weeks gestation of pregnancy ?-Doing well. ? ?3. History of prior pregnancy with IUGR newborn ?-[place] scheduled for tomorrow for fetal growth, anatomy completion, and assessment of LL placenta.  ? ?4. Obesity affecting pregnancy in second trimester ?-BMI today 31.53 ?-TWG 18lbs ?-Discussed risk factors and research surrounding bASA role in decreasing risk threshold of PreE in at risk populations.  Given recommendation for initiation of bASA considering current BMI and history. ?-Patient agreeable and prescription sent to office. ? ? ?  ?Meds: No orders of the defined types were placed in this encounter. ? ?Labs/procedures today:  ?Lab Orders  ?No laboratory test(s) ordered today  ?  ? ?Reviewed: Preterm labor symptoms and general obstetric precautions including but not limited to vaginal bleeding, contractions, leaking of fluid and fetal movement were reviewed in detail with the patient.  All questions were answered. ? ?Follow-up: Return in  about 4 weeks (around 12/31/2021) for HROB with GTT. ? ?No orders of the defined types were placed in this encounter. ? ?Maryann Conners MSN, CNM ?12/03/2021 ?

## 2021-12-04 ENCOUNTER — Ambulatory Visit: Payer: Medicaid Other

## 2021-12-09 ENCOUNTER — Telehealth: Payer: Self-pay

## 2021-12-09 NOTE — Telephone Encounter (Signed)
Pt called stating she did not go to the lab to have AFP lab drawn. Pt made aware that the test can not be done because she is 24 weeks. Understanding was voiced. ?Wille Aubuchon l Shaqueena Mauceri, CMA  ?

## 2021-12-17 ENCOUNTER — Other Ambulatory Visit: Payer: Self-pay

## 2021-12-17 ENCOUNTER — Ambulatory Visit: Payer: Medicaid Other | Attending: Obstetrics and Gynecology | Admitting: *Deleted

## 2021-12-17 ENCOUNTER — Other Ambulatory Visit: Payer: Self-pay | Admitting: *Deleted

## 2021-12-17 ENCOUNTER — Ambulatory Visit (HOSPITAL_BASED_OUTPATIENT_CLINIC_OR_DEPARTMENT_OTHER): Payer: Medicaid Other

## 2021-12-17 VITALS — BP 115/65 | HR 87

## 2021-12-17 DIAGNOSIS — Z3A25 25 weeks gestation of pregnancy: Secondary | ICD-10-CM | POA: Diagnosis not present

## 2021-12-17 DIAGNOSIS — O09292 Supervision of pregnancy with other poor reproductive or obstetric history, second trimester: Secondary | ICD-10-CM | POA: Diagnosis not present

## 2021-12-17 DIAGNOSIS — Z362 Encounter for other antenatal screening follow-up: Secondary | ICD-10-CM | POA: Diagnosis not present

## 2021-12-17 DIAGNOSIS — O283 Abnormal ultrasonic finding on antenatal screening of mother: Secondary | ICD-10-CM

## 2021-12-17 DIAGNOSIS — O09892 Supervision of other high risk pregnancies, second trimester: Secondary | ICD-10-CM | POA: Insufficient documentation

## 2021-12-17 DIAGNOSIS — O4442 Low lying placenta NOS or without hemorrhage, second trimester: Secondary | ICD-10-CM

## 2021-12-17 DIAGNOSIS — O358XX0 Maternal care for other (suspected) fetal abnormality and damage, not applicable or unspecified: Secondary | ICD-10-CM | POA: Insufficient documentation

## 2021-12-17 DIAGNOSIS — Z3689 Encounter for other specified antenatal screening: Secondary | ICD-10-CM

## 2021-12-23 ENCOUNTER — Encounter: Payer: Self-pay | Admitting: Obstetrics and Gynecology

## 2021-12-23 ENCOUNTER — Encounter: Payer: Self-pay | Admitting: General Practice

## 2021-12-23 ENCOUNTER — Ambulatory Visit (INDEPENDENT_AMBULATORY_CARE_PROVIDER_SITE_OTHER): Payer: Medicaid Other | Admitting: Obstetrics and Gynecology

## 2021-12-23 VITALS — BP 110/69 | HR 89 | Wt 185.0 lb

## 2021-12-23 DIAGNOSIS — Z3A26 26 weeks gestation of pregnancy: Secondary | ICD-10-CM

## 2021-12-23 DIAGNOSIS — Z8759 Personal history of other complications of pregnancy, childbirth and the puerperium: Secondary | ICD-10-CM

## 2021-12-23 DIAGNOSIS — O0990 Supervision of high risk pregnancy, unspecified, unspecified trimester: Secondary | ICD-10-CM | POA: Diagnosis not present

## 2021-12-23 DIAGNOSIS — O09899 Supervision of other high risk pregnancies, unspecified trimester: Secondary | ICD-10-CM

## 2021-12-23 DIAGNOSIS — O4442 Low lying placenta NOS or without hemorrhage, second trimester: Secondary | ICD-10-CM

## 2021-12-23 NOTE — Progress Notes (Signed)
? ?  PRENATAL VISIT NOTE ? ?Subjective:  ?Jasmine Buchanan is a 26 y.o. G3P1011 at [redacted]w[redacted]d being seen today for ongoing prenatal care.  She is currently monitored for the following issues for this low-risk pregnancy and has Supervision of high risk pregnancy, antepartum; Short interval between pregnancies affecting pregnancy, antepartum; History of prior pregnancy with IUGR newborn; Low-lying placenta in second trimester; and Fetal echogenic intracardiac focus on prenatal ultrasound on their problem list. ? ?Patient reports no complaints.  Contractions: Not present. Vag. Bleeding: None.  Movement: Present. Denies leaking of fluid.  ? ?The following portions of the patient's history were reviewed and updated as appropriate: allergies, current medications, past family history, past medical history, past social history, past surgical history and problem list.  ? ?Objective:  ? ?Vitals:  ? 12/23/21 0833  ?BP: 110/69  ?Pulse: 89  ?Weight: 185 lb (83.9 kg)  ? ? ?Fetal Status: Fetal Heart Rate (bpm): 145 Fundal Height: 26 cm Movement: Present    ? ?General:  Alert, oriented and cooperative. Patient is in no acute distress.  ?Skin: Skin is warm and dry. No rash noted.   ?Cardiovascular: Normal heart rate noted  ?Respiratory: Normal respiratory effort, no problems with respiration noted  ?Abdomen: Soft, gravid, appropriate for gestational age.  Pain/Pressure: Present     ?Pelvic: Cervical exam deferred        ?Extremities: Normal range of motion.  Edema: None  ?Mental Status: Normal mood and affect. Normal behavior. Normal judgment and thought content.  ? ?Assessment and Plan:  ?Pregnancy: G3P1011 at [redacted]w[redacted]d ?1. [redacted] weeks gestation of pregnancy ? ? ?2. Supervision of high risk pregnancy, antepartum ?Patient is doing well without complaints ?Third trimester labs and glucola today ? ?3. Short interval between pregnancies affecting pregnancy, antepartum ? ? ?4. Low-lying placenta in second trimester ?Resolves on 3/28 scan ? ?5. History  of prior pregnancy with IUGR newborn ?Follow up growth ultrasound as scheduled ?3/28 EFW 835 gm 63%tile ? ?Preterm labor symptoms and general obstetric precautions including but not limited to vaginal bleeding, contractions, leaking of fluid and fetal movement were reviewed in detail with the patient. ?Please refer to After Visit Summary for other counseling recommendations.  ? ?Return in about 2 weeks (around 01/06/2022) for in person, ROB, Low risk. ? ?Future Appointments  ?Date Time Provider Department Center  ?01/10/2022 11:15 AM Levie Heritage, DO [facility] None  ?01/29/2022  3:30 PM [facility] NURSE [facility]  ?01/29/2022  3:45 PM [facility] US5 [facility]  ? ? ?Catalina Antigua, MD ? ?

## 2021-12-23 NOTE — Progress Notes (Signed)
Patient states she is fasting and plans to do her glucola today. Kathrene Alu RN  ?

## 2021-12-24 LAB — CBC
Hematocrit: 37.3 % (ref 34.0–46.6)
Hemoglobin: 12.5 g/dL (ref 11.1–15.9)
MCH: 32.1 pg (ref 26.6–33.0)
MCHC: 33.5 g/dL (ref 31.5–35.7)
MCV: 96 fL (ref 79–97)
Platelets: 256 10*3/uL (ref 150–450)
RBC: 3.9 x10E6/uL (ref 3.77–5.28)
RDW: 11.9 % (ref 11.7–15.4)
WBC: 11.1 10*3/uL — ABNORMAL HIGH (ref 3.4–10.8)

## 2021-12-24 LAB — HIV ANTIBODY (ROUTINE TESTING W REFLEX): HIV Screen 4th Generation wRfx: NONREACTIVE

## 2021-12-24 LAB — GLUCOSE TOLERANCE, 2 HOURS W/ 1HR
Glucose, 1 hour: 166 mg/dL (ref 70–179)
Glucose, 2 hour: 118 mg/dL (ref 70–152)
Glucose, Fasting: 70 mg/dL (ref 70–91)

## 2021-12-24 LAB — RPR: RPR Ser Ql: NONREACTIVE

## 2022-01-09 ENCOUNTER — Encounter: Payer: Self-pay | Admitting: General Practice

## 2022-01-10 ENCOUNTER — Telehealth: Payer: Medicaid Other | Admitting: Family Medicine

## 2022-01-10 NOTE — Progress Notes (Signed)
Attempted to call patient to begin visit.unable to leave message. Armandina Stammer RN  ?

## 2022-01-10 NOTE — Progress Notes (Signed)
Patient did not keep appointment.

## 2022-01-23 ENCOUNTER — Encounter: Payer: Medicaid Other | Admitting: Family Medicine

## 2022-01-23 ENCOUNTER — Encounter: Payer: Self-pay | Admitting: General Practice

## 2022-01-23 ENCOUNTER — Telehealth: Payer: Self-pay

## 2022-01-23 NOTE — Telephone Encounter (Signed)
Called patient to start her virtual visit. ?No answer ?Left message to return call to office. Armandina Stammer RN ?

## 2022-01-29 ENCOUNTER — Ambulatory Visit: Payer: Medicaid Other | Admitting: *Deleted

## 2022-01-29 ENCOUNTER — Ambulatory Visit: Payer: Medicaid Other | Attending: Obstetrics

## 2022-01-29 VITALS — BP 120/66 | HR 86

## 2022-01-29 DIAGNOSIS — O358XX0 Maternal care for other (suspected) fetal abnormality and damage, not applicable or unspecified: Secondary | ICD-10-CM | POA: Diagnosis not present

## 2022-01-29 DIAGNOSIS — O09292 Supervision of pregnancy with other poor reproductive or obstetric history, second trimester: Secondary | ICD-10-CM | POA: Diagnosis present

## 2022-01-29 DIAGNOSIS — O4442 Low lying placenta NOS or without hemorrhage, second trimester: Secondary | ICD-10-CM | POA: Diagnosis present

## 2022-01-29 DIAGNOSIS — O09293 Supervision of pregnancy with other poor reproductive or obstetric history, third trimester: Secondary | ICD-10-CM

## 2022-01-29 DIAGNOSIS — O283 Abnormal ultrasonic finding on antenatal screening of mother: Secondary | ICD-10-CM

## 2022-01-29 DIAGNOSIS — Z3A31 31 weeks gestation of pregnancy: Secondary | ICD-10-CM

## 2022-01-29 DIAGNOSIS — O09892 Supervision of other high risk pregnancies, second trimester: Secondary | ICD-10-CM | POA: Insufficient documentation

## 2022-01-29 DIAGNOSIS — O4443 Low lying placenta NOS or without hemorrhage, third trimester: Secondary | ICD-10-CM | POA: Diagnosis not present

## 2022-01-29 DIAGNOSIS — Z362 Encounter for other antenatal screening follow-up: Secondary | ICD-10-CM

## 2022-01-29 DIAGNOSIS — Z3689 Encounter for other specified antenatal screening: Secondary | ICD-10-CM | POA: Diagnosis present

## 2022-01-30 ENCOUNTER — Encounter: Payer: Self-pay | Admitting: Family Medicine

## 2022-01-30 ENCOUNTER — Other Ambulatory Visit: Payer: Self-pay | Admitting: *Deleted

## 2022-01-30 ENCOUNTER — Telehealth (INDEPENDENT_AMBULATORY_CARE_PROVIDER_SITE_OTHER): Payer: Medicaid Other | Admitting: Family Medicine

## 2022-01-30 DIAGNOSIS — O09893 Supervision of other high risk pregnancies, third trimester: Secondary | ICD-10-CM

## 2022-01-30 DIAGNOSIS — Z8759 Personal history of other complications of pregnancy, childbirth and the puerperium: Secondary | ICD-10-CM

## 2022-01-30 DIAGNOSIS — O09899 Supervision of other high risk pregnancies, unspecified trimester: Secondary | ICD-10-CM

## 2022-01-30 DIAGNOSIS — O283 Abnormal ultrasonic finding on antenatal screening of mother: Secondary | ICD-10-CM

## 2022-01-30 DIAGNOSIS — Z3A31 31 weeks gestation of pregnancy: Secondary | ICD-10-CM

## 2022-01-30 DIAGNOSIS — O0990 Supervision of high risk pregnancy, unspecified, unspecified trimester: Secondary | ICD-10-CM

## 2022-01-30 NOTE — Progress Notes (Signed)
? ?OBSTETRICS PRENATAL VIRTUAL VISIT ENCOUNTER NOTE ? ?Provider location: Center for [employer] at [facility]  ? ?Patient location: Home ? ?I connected with Jasmine Buchanan on 01/30/22 at  1:30 PM EDT by MyChart Video Encounter and verified that I am speaking with the correct person using two identifiers. I discussed the limitations, risks, security and privacy concerns of performing an evaluation and management service virtually and the availability of in person appointments. I also discussed with the patient that there may be a patient responsible charge related to this service. The patient expressed understanding and agreed to proceed. ?Subjective:  ?Jasmine Buchanan is a 26 y.o. G3P1011 at [redacted]w[redacted]d being seen today for ongoing prenatal care.  She is currently monitored for the following issues for this high-risk pregnancy and has Supervision of high risk pregnancy, antepartum; Short interval between pregnancies affecting pregnancy, antepartum; History of prior pregnancy with IUGR newborn; Low-lying placenta in second trimester; and Fetal echogenic intracardiac focus on prenatal ultrasound on their problem list. ? ?Patient reports no complaints.  Contractions: Not present. Vag. Bleeding: None.  Movement: Present. Denies any leaking of fluid.  ? ?The following portions of the patient's history were reviewed and updated as appropriate: allergies, current medications, past family history, past medical history, past social history, past surgical history and problem list.  ? ?Objective:  ?There were no vitals filed for this visit. ? ?Fetal Status:     Movement: Present    ? ?General:  Alert, oriented and cooperative. Patient is in no acute distress.  ?Respiratory: Normal respiratory effort, no problems with respiration noted  ?Mental Status: Normal mood and affect. Normal behavior. Normal judgment and thought content.  ?Rest of physical exam deferred due to type of encounter ? ?Imaging: ?[place] MFM OB FOLLOW  UP ? ?Result Date: 01/29/2022 ?----------------------------------------------------------------------  OBSTETRICS REPORT                       (Signed Final 01/29/2022 04:41 pm) ---------------------------------------------------------------------- Patient Info  ID #:       [id number]                          D.O.B.:  08-Jul-1996 (25 yrs)  Name:       Jasmine Buchanan                   Visit Date: 01/29/2022 03:44 pm ---------------------------------------------------------------------- Performed By  Attending:        Johnell Comings MD         Ref. Address:     [place]  Performed By:     Rodrigo Ran BS      [facility]  Referred By:      [facility] [place] ---------------------------------------------------------------------- Orders  #  Description                           Code        Ordered By  1  [place] MFM OB FOLLOW UP                   [phone]    Peterson Ao ----------------------------------------------------------------------  #  Order #                     Accession #                Episode #  1  [id number]                   [id number]                 [id number] ---------------------------------------------------------------------- Indications  Short interval between pregancies, 3rd  trimester  Echogenic intracardiac focus of the heart      O35.8XX0  (EIF)  [redacted] weeks gestation of pregnancy                Z3A.31  Encounter for other antenatal screening        Z36.2  follow-up  Low lying placenta, antepartum (Resolved)      O44.40  Low risk NIPS, neg Horizon  Poor obstetric history: Previous fetal growth  O09.299  restriction (FGR) ---------------------------------------------------------------------- Fetal Evaluation  Num  Of Fetuses:         1  Fetal Heart Rate(bpm):  129  Cardiac Activity:       Observed  Presentation:           Cephalic  Placenta:               Posterior  P. Cord Insertion:      Previously Visualized  Amniotic Fluid  AFI FV:      Within normal limits  AFI Sum(cm)     %Tile       Largest Pocket(cm)  20.3            78          6.6  RUQ(cm)       RLQ(cm)       LUQ(cm)        LLQ(cm)  5.8           3.9           4              6.6 ---------------------------------------------------------------------- Biometry  BPD:      81.2  mm     G. Age:  32w 4d         79  %    CI:        76.38   %    70 - 86  FL/HC:      20.9   %    19.3 - 21.3  HC:      294.4  mm     G. Age:  32w 4d         47  %    HC/AC:      1.08        0.96 - 1.17  AC:      272.9  mm     G. Age:  31w 3d         50  %    FL/BPD:     75.9   %    71 - 87  FL:       61.6  mm     G. Age:  32w 0d         55  %    FL/AC:      22.6   %    20 - 24  LV:        1.7  mm  Est. FW:    1833  gm      4 lb 1 oz     54  % ---------------------------------------------------------------------- OB History  Gravidity:    2         Term:   1        Prem:   0        SAB:   0  TOP:          0       Ectopic:  0        Living: 1 ---------------------------------------------------------------------- Gestational Age  LMP:           31w 2d        Date:  06/24/21                 EDD:   03/31/22  U/S Today:     32w 1d                                        EDD:   03/25/22  Best:          31w 2d     Det. By:  LMP  (06/24/21)          EDD:   03/31/22 ---------------------------------------------------------------------- Anatomy  Cranium:               Appears normal         Aortic Arch:            Previously seen  Cavum:                 Appears normal         Ductal Arch:            Previously seen  Ventricles:            Appears normal         Diaphragm:              Previously seen  Choroid Plexus:        Previously seen         Stomach:                Appears normal, left  sided  Cerebellum:            Previously seen        Abdomen:                Appears normal  Posterior Fossa:       Previously seen        Abdominal Wall:         Previously seen  Nuchal Fold:           Previously seen        Cord Vessels:           Previously seen  Face:                  Orbits and profile     Kidneys:                Appear normal                         previously seen  Lips:                  Previously seen        Bladder:                Appears normal  Thoracic:              Appears normal         Spine:                  Previously seen  Heart:                 Appears normal;        Upper Extremities:      Previously seen                         EIF prev vis  RVOT:                  Previously seen        Lower Extremities:      Previously seen  LVOT:                  Previously seen  Other:  VC, 3VV, heels/feet, open hands/5th digits, nasal bone, lenses,          maxilla, mandible previously visualized ---------------------------------------------------------------------- Cervix Uterus Adnexa  Cervix  Not visualized (advanced GA >24wks)  Uterus  No abnormality visualized.  Right Ovary  Not visualized.  Left Ovary  Not visualized.  Cul De Sac  No free fluid seen.  Adnexa  No abnormality visualized. ---------------------------------------------------------------------- Comments  This patient was seen for a follow up growth due to a history  of a prior pregnancy that was complicated by IUGR.  She  denies any problems since her last exam.  She was informed that the fetal growth and amniotic fluid  level appears appropriate for her gestational age.  Due to her history of IUGR, a follow-up growth scan was  scheduled in 5 weeks. ----------------------------------------------------------------------                   Johnell Comings, MD Electronically Signed Final Report   01/29/2022 04:41 pm  ----------------------------------------------------------------------  ? ?Assessment and Plan:  ?Pregnancy: G3P1011 at [redacted]w[redacted]d ?1. Fetal echogenic intracardiac focus on prenatal ultrasound ?Seen in last [place] ?

## 2022-02-13 ENCOUNTER — Encounter: Payer: Medicaid Other | Admitting: Family Medicine

## 2022-02-27 ENCOUNTER — Encounter: Payer: Medicaid Other | Admitting: Family Medicine

## 2022-02-28 ENCOUNTER — Encounter: Payer: Self-pay | Admitting: Obstetrics and Gynecology

## 2022-02-28 ENCOUNTER — Ambulatory Visit (INDEPENDENT_AMBULATORY_CARE_PROVIDER_SITE_OTHER): Payer: Medicaid Other | Admitting: Obstetrics and Gynecology

## 2022-02-28 ENCOUNTER — Other Ambulatory Visit (HOSPITAL_COMMUNITY)
Admission: RE | Admit: 2022-02-28 | Discharge: 2022-02-28 | Disposition: A | Discharge status: Home / Self Care | Payer: Medicaid Other | Source: Ambulatory Visit | Attending: Family Medicine | Admitting: Family Medicine

## 2022-02-28 VITALS — BP 116/69 | HR 110 | Wt 202.0 lb

## 2022-02-28 DIAGNOSIS — O4442 Low lying placenta NOS or without hemorrhage, second trimester: Secondary | ICD-10-CM

## 2022-02-28 DIAGNOSIS — O09899 Supervision of other high risk pregnancies, unspecified trimester: Secondary | ICD-10-CM | POA: Diagnosis not present

## 2022-02-28 DIAGNOSIS — Z3A35 35 weeks gestation of pregnancy: Secondary | ICD-10-CM | POA: Diagnosis not present

## 2022-02-28 DIAGNOSIS — O0990 Supervision of high risk pregnancy, unspecified, unspecified trimester: Secondary | ICD-10-CM | POA: Diagnosis not present

## 2022-02-28 DIAGNOSIS — Z8759 Personal history of other complications of pregnancy, childbirth and the puerperium: Secondary | ICD-10-CM

## 2022-02-28 NOTE — Progress Notes (Signed)
   PRENATAL VISIT NOTE  Subjective:  Jasmine Buchanan is a 26 y.o. G3P1011 at [redacted]w[redacted]d being seen today for ongoing prenatal care.  She is currently monitored for the following issues for this high-risk pregnancy and has Supervision of high risk pregnancy, antepartum; Short interval between pregnancies affecting pregnancy, antepartum; History of prior pregnancy with IUGR newborn; Low-lying placenta in second trimester; and Fetal echogenic intracardiac focus on prenatal ultrasound on their problem list.  Patient reports no complaints.  Contractions: Irritability. Vag. Bleeding: None.  Movement: Present. Denies leaking of fluid.   The following portions of the patient's history were reviewed and updated as appropriate: allergies, current medications, past family history, past medical history, past social history, past surgical history and problem list.   Objective:   Vitals:   02/28/22 0921  BP: 116/69  Pulse: (!) 110  Weight: 202 lb 0.6 oz (91.6 kg)    Fetal Status: Fetal Heart Rate (bpm): 147 Fundal Height: 35 cm Movement: Present  Presentation: Vertex  General:  Alert, oriented and cooperative. Patient is in no acute distress.  Skin: Skin is warm and dry. No rash noted.   Cardiovascular: Normal heart rate noted  Respiratory: Normal respiratory effort, no problems with respiration noted  Abdomen: Soft, gravid, appropriate for gestational age.  Pain/Pressure: Absent     Pelvic: Cervical exam deferred Dilation: 1 Effacement (%): Thick Station: Ballotable  Extremities: Normal range of motion.     Mental Status: Normal mood and affect. Normal behavior. Normal judgment and thought content.   Assessment and Plan:  Pregnancy: G3P1011 at [redacted]w[redacted]d 1. Supervision of high risk pregnancy, antepartum Patient is doing well without complaints Cultures today Patient is exploring contraception options  2. Short interval between pregnancies affecting pregnancy, antepartum   3. [redacted] weeks gestation of  pregnancy   4. History of prior pregnancy with IUGR newborn EFW 54%tile on last scan Follow up scan on 6/14  5. Low-lying placenta in second trimester Resolved  Preterm labor symptoms and general obstetric precautions including but not limited to vaginal bleeding, contractions, leaking of fluid and fetal movement were reviewed in detail with the patient. Please refer to After Visit Summary for other counseling recommendations.   Return in about 1 week (around 03/07/2022) for in person, ROB, High risk.  Future Appointments  Date Time Provider [place]  03/05/2022  3:30 PM [facility] NURSE [facility]  03/05/2022  3:45 PM [facility] US4 [facility]  03/13/2022  1:10 PM Truett Mainland, DO [facility] None  03/20/2022  1:50 PM Truett Mainland, DO [facility] None  03/27/2022  1:30 PM Nehemiah Settle Tanna Savoy, DO [facility] None    Mora Bellman, MD

## 2022-03-03 LAB — GC/CHLAMYDIA PROBE AMP (~~LOC~~) NOT AT ARMC
Chlamydia: NEGATIVE
Comment: NEGATIVE
Comment: NORMAL
Neisseria Gonorrhea: NEGATIVE

## 2022-03-04 LAB — CULTURE, BETA STREP (GROUP B ONLY): Strep Gp B Culture: NEGATIVE

## 2022-03-05 ENCOUNTER — Ambulatory Visit: Payer: Medicaid Other | Attending: Obstetrics

## 2022-03-05 ENCOUNTER — Ambulatory Visit: Payer: Medicaid Other | Admitting: *Deleted

## 2022-03-05 VITALS — BP 129/66 | HR 83

## 2022-03-05 DIAGNOSIS — O4443 Low lying placenta NOS or without hemorrhage, third trimester: Secondary | ICD-10-CM | POA: Diagnosis not present

## 2022-03-05 DIAGNOSIS — O09293 Supervision of pregnancy with other poor reproductive or obstetric history, third trimester: Secondary | ICD-10-CM | POA: Diagnosis not present

## 2022-03-05 DIAGNOSIS — O4442 Low lying placenta NOS or without hemorrhage, second trimester: Secondary | ICD-10-CM

## 2022-03-05 DIAGNOSIS — Z362 Encounter for other antenatal screening follow-up: Secondary | ICD-10-CM

## 2022-03-05 DIAGNOSIS — Z8759 Personal history of other complications of pregnancy, childbirth and the puerperium: Secondary | ICD-10-CM | POA: Insufficient documentation

## 2022-03-05 DIAGNOSIS — O358XX0 Maternal care for other (suspected) fetal abnormality and damage, not applicable or unspecified: Secondary | ICD-10-CM

## 2022-03-05 DIAGNOSIS — Z3A36 36 weeks gestation of pregnancy: Secondary | ICD-10-CM

## 2022-03-05 DIAGNOSIS — O283 Abnormal ultrasonic finding on antenatal screening of mother: Secondary | ICD-10-CM

## 2022-03-13 ENCOUNTER — Ambulatory Visit (INDEPENDENT_AMBULATORY_CARE_PROVIDER_SITE_OTHER): Payer: Medicaid Other | Admitting: Family Medicine

## 2022-03-13 VITALS — BP 125/76 | HR 87 | Wt 206.0 lb

## 2022-03-13 DIAGNOSIS — Z8759 Personal history of other complications of pregnancy, childbirth and the puerperium: Secondary | ICD-10-CM

## 2022-03-13 DIAGNOSIS — O283 Abnormal ultrasonic finding on antenatal screening of mother: Secondary | ICD-10-CM

## 2022-03-13 DIAGNOSIS — O4442 Low lying placenta NOS or without hemorrhage, second trimester: Secondary | ICD-10-CM

## 2022-03-13 DIAGNOSIS — O0990 Supervision of high risk pregnancy, unspecified, unspecified trimester: Secondary | ICD-10-CM

## 2022-03-13 DIAGNOSIS — O09899 Supervision of other high risk pregnancies, unspecified trimester: Secondary | ICD-10-CM

## 2022-03-13 NOTE — Progress Notes (Signed)
   PRENATAL VISIT NOTE  Subjective:  Jasmine Buchanan is a 26 y.o. G3P1011 at [redacted]w[redacted]d being seen today for ongoing prenatal care.  She is currently monitored for the following issues for this low-risk pregnancy and has Supervision of high risk pregnancy, antepartum; Short interval between pregnancies affecting pregnancy, antepartum; History of prior pregnancy with IUGR newborn; Low-lying placenta in second trimester; and Fetal echogenic intracardiac focus on prenatal ultrasound on their problem list.  Patient reports no complaints.  Contractions: Not present. Vag. Bleeding: None.  Movement: Present. Denies leaking of fluid.   The following portions of the patient's history were reviewed and updated as appropriate: allergies, current medications, past family history, past medical history, past social history, past surgical history and problem list.   Objective:   Vitals:   03/13/22 1258  BP: 125/76  Pulse: 87  Weight: 206 lb (93.4 kg)    Fetal Status: Fetal Heart Rate (bpm): 145   Movement: Present  Presentation: Vertex  General:  Alert, oriented and cooperative. Patient is in no acute distress.  Skin: Skin is warm and dry. No rash noted.   Cardiovascular: Normal heart rate noted  Respiratory: Normal respiratory effort, no problems with respiration noted  Abdomen: Soft, gravid, appropriate for gestational age.  Pain/Pressure: Absent     Pelvic: Cervical exam deferred Dilation: 1      Extremities: Normal range of motion.  Edema: None  Mental Status: Normal mood and affect. Normal behavior. Normal judgment and thought content.   Assessment and Plan:  Pregnancy: G3P1011 at [redacted]w[redacted]d 1. Supervision of high risk pregnancy, antepartum FHT and FH normal  2. Low-lying placenta in second trimester resolved  3. Fetal echogenic intracardiac focus on prenatal ultrasound   4. Short interval between pregnancies affecting pregnancy, antepartum  5. History of prior pregnancy with IUGR  newborn Noraml growth   Term labor symptoms and general obstetric precautions including but not limited to vaginal bleeding, contractions, leaking of fluid and fetal movement were reviewed in detail with the patient. Please refer to After Visit Summary for other counseling recommendations.   No follow-ups on file.  Future Appointments  Date Time Provider Department Center  03/20/2022  1:50 PM Levie Heritage, DO [facility] None  03/27/2022  1:30 PM Levie Heritage, DO [facility] None    Levie Heritage, DO

## 2022-03-20 ENCOUNTER — Ambulatory Visit (INDEPENDENT_AMBULATORY_CARE_PROVIDER_SITE_OTHER): Payer: Medicaid Other | Admitting: Family Medicine

## 2022-03-20 VITALS — BP 105/66 | HR 98 | Wt 206.0 lb

## 2022-03-20 DIAGNOSIS — O283 Abnormal ultrasonic finding on antenatal screening of mother: Secondary | ICD-10-CM

## 2022-03-20 DIAGNOSIS — O0990 Supervision of high risk pregnancy, unspecified, unspecified trimester: Secondary | ICD-10-CM

## 2022-03-20 DIAGNOSIS — Z8759 Personal history of other complications of pregnancy, childbirth and the puerperium: Secondary | ICD-10-CM

## 2022-03-20 DIAGNOSIS — O09899 Supervision of other high risk pregnancies, unspecified trimester: Secondary | ICD-10-CM

## 2022-03-20 NOTE — Addendum Note (Signed)
Addended by: Levie Heritage on: 03/20/2022 02:59 PM   Modules accepted: Orders

## 2022-03-20 NOTE — Progress Notes (Signed)
   PRENATAL VISIT NOTE  Subjective:  Jasmine Buchanan is a 26 y.o. G3P1011 at [redacted]w[redacted]d being seen today for ongoing prenatal care.  She is currently monitored for the following issues for this high-risk pregnancy and has Supervision of high risk pregnancy, antepartum; Short interval between pregnancies affecting pregnancy, antepartum; History of prior pregnancy with IUGR newborn; Low-lying placenta in second trimester; and Fetal echogenic intracardiac focus on prenatal ultrasound on their problem list.  Patient reports no complaints.  Contractions: Irregular. Vag. Bleeding: None.  Movement: Present. Denies leaking of fluid.   The following portions of the patient's history were reviewed and updated as appropriate: allergies, current medications, past family history, past medical history, past social history, past surgical history and problem list.   Objective:   Vitals:   03/20/22 1355 03/20/22 1359  BP: (!) 145/69 105/66  Pulse: 98 98  Weight: 206 lb (93.4 kg)     Fetal Status: Fetal Heart Rate (bpm): 143   Movement: Present  Presentation: Vertex  General:  Alert, oriented and cooperative. Patient is in no acute distress.  Skin: Skin is warm and dry. No rash noted.   Cardiovascular: Normal heart rate noted  Respiratory: Normal respiratory effort, no problems with respiration noted  Abdomen: Soft, gravid, appropriate for gestational age.  Pain/Pressure: Absent     Pelvic: Performed by RN  Dilation: 1.5 Effacement (%): Thick Station: Ballotable  Extremities: Normal range of motion.  Edema: Trace  Mental Status: Normal mood and affect. Normal behavior. Normal judgment and thought content.   Assessment and Plan:  Pregnancy: G3P1011 at [redacted]w[redacted]d  1. Supervision of high risk pregnancy, antepartum FHT and FH normal Patient requesting induction at 40 weeks Will schedule for Foley balloon on 7/10 and induction that night.  2. Fetal echogenic intracardiac focus on prenatal ultrasound  3. Short  interval between pregnancies affecting pregnancy, antepartum  4. History of prior pregnancy with IUGR newborn  Growth 42%  Term labor symptoms and general obstetric precautions including but not limited to vaginal bleeding, contractions, leaking of fluid and fetal movement were reviewed in detail with the patient. Please refer to After Visit Summary for other counseling recommendations.   No follow-ups on file.  Future Appointments  Date Time Provider Department Center  03/27/2022  1:30 PM Levie Heritage, DO [facility] None    Levie Heritage, DO

## 2022-03-21 ENCOUNTER — Encounter (HOSPITAL_COMMUNITY): Payer: Self-pay | Admitting: *Deleted

## 2022-03-21 ENCOUNTER — Telehealth (HOSPITAL_COMMUNITY): Payer: Self-pay | Admitting: *Deleted

## 2022-03-21 NOTE — Telephone Encounter (Signed)
Preadmission screen  

## 2022-03-27 ENCOUNTER — Ambulatory Visit (INDEPENDENT_AMBULATORY_CARE_PROVIDER_SITE_OTHER): Payer: Medicaid Other | Admitting: Family Medicine

## 2022-03-27 ENCOUNTER — Other Ambulatory Visit: Payer: Self-pay | Admitting: Advanced Practice Midwife

## 2022-03-27 VITALS — BP 112/72 | HR 88 | Wt 209.0 lb

## 2022-03-27 DIAGNOSIS — O283 Abnormal ultrasonic finding on antenatal screening of mother: Secondary | ICD-10-CM

## 2022-03-27 DIAGNOSIS — Z8759 Personal history of other complications of pregnancy, childbirth and the puerperium: Secondary | ICD-10-CM

## 2022-03-27 DIAGNOSIS — Z3A39 39 weeks gestation of pregnancy: Secondary | ICD-10-CM

## 2022-03-27 DIAGNOSIS — O09899 Supervision of other high risk pregnancies, unspecified trimester: Secondary | ICD-10-CM

## 2022-03-27 DIAGNOSIS — O0990 Supervision of high risk pregnancy, unspecified, unspecified trimester: Secondary | ICD-10-CM

## 2022-03-27 NOTE — Progress Notes (Signed)
   PRENATAL VISIT NOTE  Subjective:  Jasmine Buchanan is a 26 y.o. G3P1011 at [redacted]w[redacted]d being seen today for ongoing prenatal care.  She is currently monitored for the following issues for this low-risk pregnancy and has Supervision of high risk pregnancy, antepartum; Short interval between pregnancies affecting pregnancy, antepartum; History of prior pregnancy with IUGR newborn; and Fetal echogenic intracardiac focus on prenatal ultrasound on their problem list.  Patient reports occasional contractions.  Contractions: Not present. Vag. Bleeding: None.  Movement: Present. Denies leaking of fluid.   The following portions of the patient's history were reviewed and updated as appropriate: allergies, current medications, past family history, past medical history, past social history, past surgical history and problem list.   Objective:   Vitals:   03/27/22 1310  BP: 112/72  Pulse: 88  Weight: 209 lb (94.8 kg)    Fetal Status: Fetal Heart Rate (bpm): 132   Movement: Present     General:  Alert, oriented and cooperative. Patient is in no acute distress.  Skin: Skin is warm and dry. No rash noted.   Cardiovascular: Normal heart rate noted  Respiratory: Normal respiratory effort, no problems with respiration noted  Abdomen: Soft, gravid, appropriate for gestational age.  Pain/Pressure: Present     Pelvic: Cervical exam deferred        Extremities: Normal range of motion.  Edema: Trace  Mental Status: Normal mood and affect. Normal behavior. Normal judgment and thought content.   Assessment and Plan:  Pregnancy: G3P1011 at [redacted]w[redacted]d 1. [redacted] weeks gestation of pregnancy  2. Supervision of high risk pregnancy, antepartum FHT and FH normal Scheduled for induction at 40 weeks so she can arrange child care, foley balloon insertion the day before.    3. Fetal echogenic intracardiac focus on prenatal ultrasound  4. Short interval between pregnancies affecting pregnancy, antepartum  5. History of prior  pregnancy with IUGR newborn Appropriate growth  Term labor symptoms and general obstetric precautions including but not limited to vaginal bleeding, contractions, leaking of fluid and fetal movement were reviewed in detail with the patient. Please refer to After Visit Summary for other counseling recommendations.   No follow-ups on file.  Future Appointments  Date Time Provider Department Center  03/27/2022  1:30 PM Levie Heritage, DO [facility] None  03/31/2022  9:35 AM Reva Bores, MD [facility] None  04/01/2022  7:30 AM [facility] SCHED ROOM [facility] None    Levie Heritage, DO

## 2022-03-29 ENCOUNTER — Inpatient Hospital Stay (HOSPITAL_COMMUNITY): Payer: Medicaid Other | Admitting: Anesthesiology

## 2022-03-29 ENCOUNTER — Inpatient Hospital Stay (HOSPITAL_COMMUNITY)
Admission: AD | Admit: 2022-03-29 | Discharge: 2022-03-31 | DRG: 807 | Disposition: A | Discharge status: Home / Self Care | Payer: Medicaid Other | Attending: Obstetrics & Gynecology | Admitting: Obstetrics & Gynecology

## 2022-03-29 ENCOUNTER — Encounter (HOSPITAL_COMMUNITY): Payer: Self-pay | Admitting: Family Medicine

## 2022-03-29 DIAGNOSIS — Z87891 Personal history of nicotine dependence: Secondary | ICD-10-CM

## 2022-03-29 DIAGNOSIS — O0990 Supervision of high risk pregnancy, unspecified, unspecified trimester: Secondary | ICD-10-CM

## 2022-03-29 DIAGNOSIS — Z3A39 39 weeks gestation of pregnancy: Secondary | ICD-10-CM

## 2022-03-29 DIAGNOSIS — O26893 Other specified pregnancy related conditions, third trimester: Secondary | ICD-10-CM | POA: Diagnosis not present

## 2022-03-29 DIAGNOSIS — O283 Abnormal ultrasonic finding on antenatal screening of mother: Secondary | ICD-10-CM | POA: Diagnosis present

## 2022-03-29 DIAGNOSIS — O09899 Supervision of other high risk pregnancies, unspecified trimester: Secondary | ICD-10-CM

## 2022-03-29 DIAGNOSIS — Z3490 Encounter for supervision of normal pregnancy, unspecified, unspecified trimester: Secondary | ICD-10-CM

## 2022-03-29 DIAGNOSIS — Z8759 Personal history of other complications of pregnancy, childbirth and the puerperium: Secondary | ICD-10-CM

## 2022-03-29 LAB — CBC
HCT: 37.2 % (ref 36.0–46.0)
Hemoglobin: 12.5 g/dL (ref 12.0–15.0)
MCH: 31.6 pg (ref 26.0–34.0)
MCHC: 33.6 g/dL (ref 30.0–36.0)
MCV: 93.9 fL (ref 80.0–100.0)
Platelets: 241 10*3/uL (ref 150–400)
RBC: 3.96 MIL/uL (ref 3.87–5.11)
RDW: 13 % (ref 11.5–15.5)
WBC: 12.8 10*3/uL — ABNORMAL HIGH (ref 4.0–10.5)
nRBC: 0 % (ref 0.0–0.2)

## 2022-03-29 LAB — TYPE AND SCREEN
ABO/RH(D): A POS
Antibody Screen: NEGATIVE

## 2022-03-29 LAB — RPR: RPR Ser Ql: NONREACTIVE

## 2022-03-29 MED ORDER — PRENATAL MULTIVITAMIN CH
1.0000 | ORAL_TABLET | Freq: Every day | ORAL | Status: DC
  Administered 2022-03-30 – 2022-03-31 (×2): 1 via ORAL
  Filled 2022-03-29 (×2): qty 1

## 2022-03-29 MED ORDER — ONDANSETRON HCL 4 MG/2ML IJ SOLN
4.0000 mg | Freq: Four times a day (QID) | INTRAMUSCULAR | Status: DC | PRN
Start: 2022-03-29 — End: 2022-03-29

## 2022-03-29 MED ORDER — TETANUS-DIPHTH-ACELL PERTUSSIS 5-2.5-18.5 LF-MCG/0.5 IM SUSY: 0.5000 mL | PREFILLED_SYRINGE | Freq: Once | INTRAMUSCULAR | Status: DC

## 2022-03-29 MED ORDER — PHENYLEPHRINE 80 MCG/ML (10ML) SYRINGE FOR IV PUSH (FOR BLOOD PRESSURE SUPPORT): 80.0000 ug | PREFILLED_SYRINGE | INTRAVENOUS | Status: DC | PRN

## 2022-03-29 MED ORDER — OXYCODONE-ACETAMINOPHEN 5-325 MG PO TABS: 1.0000 | ORAL_TABLET | ORAL | Status: DC | PRN

## 2022-03-29 MED ORDER — ACETAMINOPHEN 325 MG PO TABS
650.0000 mg | ORAL_TABLET | ORAL | Status: DC | PRN
  Administered 2022-03-30: 650 mg via ORAL
  Filled 2022-03-29: qty 2

## 2022-03-29 MED ORDER — DIPHENHYDRAMINE HCL 25 MG PO CAPS: 25.0000 mg | ORAL_CAPSULE | Freq: Four times a day (QID) | ORAL | Status: DC | PRN

## 2022-03-29 MED ORDER — OXYTOCIN-SODIUM CHLORIDE 30-0.9 UT/500ML-% IV SOLN
2.5000 [IU]/h | INTRAVENOUS | Status: DC
  Administered 2022-03-29: 2.5 [IU]/h via INTRAVENOUS

## 2022-03-29 MED ORDER — OXYTOCIN BOLUS FROM INFUSION
333.0000 mL | Freq: Once | INTRAVENOUS | Status: AC
  Administered 2022-03-29: 333 mL via INTRAVENOUS

## 2022-03-29 MED ORDER — ZOLPIDEM TARTRATE 5 MG PO TABS: 5.0000 mg | ORAL_TABLET | Freq: Every evening | ORAL | Status: DC | PRN

## 2022-03-29 MED ORDER — WITCH HAZEL-GLYCERIN EX PADS: 1.0000 | MEDICATED_PAD | CUTANEOUS | Status: DC | PRN

## 2022-03-29 MED ORDER — DIBUCAINE (PERIANAL) 1 % EX OINT: 1.0000 | TOPICAL_OINTMENT | CUTANEOUS | Status: DC | PRN

## 2022-03-29 MED ORDER — IBUPROFEN 600 MG PO TABS
600.0000 mg | ORAL_TABLET | Freq: Four times a day (QID) | ORAL | Status: DC
  Administered 2022-03-29 – 2022-03-31 (×7): 600 mg via ORAL
  Filled 2022-03-29 (×7): qty 1

## 2022-03-29 MED ORDER — BENZOCAINE-MENTHOL 20-0.5 % EX AERO: 1.0000 | INHALATION_SPRAY | CUTANEOUS | Status: DC | PRN

## 2022-03-29 MED ORDER — ONDANSETRON HCL 4 MG/2ML IJ SOLN: 4.0000 mg | INTRAMUSCULAR | Status: DC | PRN

## 2022-03-29 MED ORDER — EPHEDRINE 5 MG/ML INJ
10.0000 mg | INTRAVENOUS | Status: DC | PRN
  Filled 2022-03-29: qty 5

## 2022-03-29 MED ORDER — LACTATED RINGERS IV SOLN
500.0000 mL | Freq: Once | INTRAVENOUS | Status: AC
Start: 2022-03-29 — End: 2022-03-29
  Administered 2022-03-29: 500 mL via INTRAVENOUS

## 2022-03-29 MED ORDER — LACTATED RINGERS IV SOLN: 500.0000 mL | INTRAVENOUS | Status: DC | PRN

## 2022-03-29 MED ORDER — LIDOCAINE-EPINEPHRINE (PF) 2 %-1:200000 IJ SOLN
INTRAMUSCULAR | Status: DC | PRN
  Administered 2022-03-29: 5 mL via EPIDURAL

## 2022-03-29 MED ORDER — ONDANSETRON HCL 4 MG PO TABS: 4.0000 mg | ORAL_TABLET | ORAL | Status: DC | PRN

## 2022-03-29 MED ORDER — LACTATED RINGERS IV SOLN: INTRAVENOUS | Status: DC

## 2022-03-29 MED ORDER — DIPHENHYDRAMINE HCL 50 MG/ML IJ SOLN: 12.5000 mg | INTRAMUSCULAR | Status: DC | PRN

## 2022-03-29 MED ORDER — SOD CITRATE-CITRIC ACID 500-334 MG/5ML PO SOLN: 30.0000 mL | ORAL | Status: DC | PRN

## 2022-03-29 MED ORDER — SIMETHICONE 80 MG PO CHEW: 80.0000 mg | CHEWABLE_TABLET | ORAL | Status: DC | PRN

## 2022-03-29 MED ORDER — SENNOSIDES-DOCUSATE SODIUM 8.6-50 MG PO TABS
2.0000 | ORAL_TABLET | Freq: Every day | ORAL | Status: DC
  Filled 2022-03-29 (×2): qty 2

## 2022-03-29 MED ORDER — LIDOCAINE HCL (PF) 1 % IJ SOLN: 30.0000 mL | INTRAMUSCULAR | Status: DC | PRN

## 2022-03-29 MED ORDER — TERBUTALINE SULFATE 1 MG/ML IJ SOLN: 0.2500 mg | Freq: Once | INTRAMUSCULAR | Status: DC | PRN

## 2022-03-29 MED ORDER — PHENYLEPHRINE 80 MCG/ML (10ML) SYRINGE FOR IV PUSH (FOR BLOOD PRESSURE SUPPORT)
80.0000 ug | PREFILLED_SYRINGE | INTRAVENOUS | Status: DC | PRN
  Filled 2022-03-29: qty 10

## 2022-03-29 MED ORDER — ONDANSETRON HCL 4 MG/2ML IJ SOLN
4.0000 mg | Freq: Once | INTRAMUSCULAR | Status: AC
  Administered 2022-03-29: 4 mg via INTRAMUSCULAR
  Filled 2022-03-29: qty 2

## 2022-03-29 MED ORDER — COCONUT OIL OIL: 1.0000 | TOPICAL_OIL | Status: DC | PRN

## 2022-03-29 MED ORDER — OXYTOCIN-SODIUM CHLORIDE 30-0.9 UT/500ML-% IV SOLN
1.0000 m[IU]/min | INTRAVENOUS | Status: DC
  Administered 2022-03-29: 2 m[IU]/min via INTRAVENOUS

## 2022-03-29 MED ORDER — ACETAMINOPHEN 325 MG PO TABS: 650.0000 mg | ORAL_TABLET | ORAL | Status: DC | PRN

## 2022-03-29 MED ORDER — EPHEDRINE 5 MG/ML INJ: 10.0000 mg | INTRAVENOUS | Status: DC | PRN

## 2022-03-29 MED ORDER — OXYCODONE-ACETAMINOPHEN 5-325 MG PO TABS: 2.0000 | ORAL_TABLET | ORAL | Status: DC | PRN

## 2022-03-29 MED ORDER — MORPHINE SULFATE (PF) 4 MG/ML IV SOLN
4.0000 mg | Freq: Once | INTRAVENOUS | Status: AC
  Administered 2022-03-29: 4 mg via INTRAMUSCULAR
  Filled 2022-03-29: qty 1

## 2022-03-29 MED ORDER — FENTANYL-BUPIVACAINE-NACL 0.5-0.125-0.9 MG/250ML-% EP SOLN
12.0000 mL/h | EPIDURAL | Status: DC | PRN
  Administered 2022-03-29: 12 mL/h via EPIDURAL
  Filled 2022-03-29: qty 250

## 2022-03-29 NOTE — H&P (Signed)
OBSTETRIC ADMISSION HISTORY AND PHYSICAL  Jasmine Buchanan is a 26 y.o. female G3P1011 with IUP at [redacted]w[redacted]d presenting for labor. She reports +FMs. No LOF, VB, blurry vision, headaches, peripheral edema, or RUQ pain. She plans on breastfeeding. She requests condoms for birth control.  Dating: By LMP --->  Estimated Date of Delivery: 03/31/22  Sono:    @[redacted]w[redacted]d , normal anatomy, cephalic presentation, 2806g, , EFW 6'3  Prenatal History/Complications: -low lying placenta-resolved -hx IUGR in previous pregnancy -short pregnancy interval -fetal EIF  Past Medical History: Past Medical History:  Diagnosis Date   Medical history non-contributory     Past Surgical History: Past Surgical History:  Procedure Laterality Date   NO PAST SURGERIES      Obstetrical History: OB History     Gravida  3   Para  1   Term  1   Preterm      AB  1   Living  1      SAB      IAB  1   Ectopic      Multiple      Live Births  1           Social History: Social History   Socioeconomic History   Marital status: Single    Spouse name: Not on file   Number of children: Not on file   Years of education: Not on file   Highest education level: Not on file  Occupational History   Not on file  Tobacco Use   Smoking status: Former    Types: Cigarettes    Quit date: 07/23/2021    Years since quitting: 0.6   Smokeless tobacco: Never  Vaping Use   Vaping Use: Never used  Substance and Sexual Activity   Alcohol use: Not Currently   Drug use: Never   Sexual activity: Yes    Partners: Male  Other Topics Concern   Not on file  Social History Narrative   Not on file   Social Determinants of Health   Financial Resource Strain: Not on file  Food Insecurity: Not on file  Transportation Needs: Not on file  Physical Activity: Not on file  Stress: Not on file  Social Connections: Not on file    Family History: Family History  Problem Relation Age of Onset   Kidney disease  Father    Breast cancer Paternal Grandmother     Allergies: No Known Allergies  Medications Prior to Admission  Medication Sig Dispense Refill Last Dose   Prenatal Vit-Fe Fumarate-FA (PRENATAL VITAMINS PO) Take by mouth.   03/28/2022     Review of Systems:  All systems reviewed and negative except as stated in HPI  PE: Blood pressure 137/82, pulse 90, temperature 98.1 F (36.7 C), temperature source Oral, resp. rate 17, height 5\' 3"  (1.6 m), weight 96 kg, last menstrual period 06/24/2021, SpO2 99 %, unknown if currently breastfeeding. General appearance: alert, cooperative, and mild distress Lungs: regular rate and effort Heart: regular rate  Abdomen: soft, non-tender Extremities: Homans sign is negative, no sign of DVT Presentation: cephalic EFM: 135 bpm, mod variability, + accels, no decels Toco: Q 3 minutes Dilation: 5.5 Effacement (%): 90 Station: -3 Exam by:: 08/24/2021 NP   Prenatal labs: ABO, Rh: A/Positive/-- (01/10 0959) Antibody: Negative (01/10 0959) Rubella: 2.07 (01/10 0959) RPR: Non Reactive (04/03 0855)  HBsAg: Negative (01/10 0959)  HIV: Non Reactive (04/03 0855)  GBS: Negative/-- (06/09 1034)  2 hr GTT 70/166/118  Prenatal Transfer  Tool  Maternal Diabetes: No Genetic Screening: Normal Maternal Ultrasounds/Referrals: Isolated EIF (echogenic intracardiac focus) Fetal Ultrasounds or other Referrals:  None Maternal Substance Abuse:  No Significant Maternal Medications:  None Significant Maternal Lab Results: Group B Strep negative  No results found for this or any previous visit (from the past 24 hour(s)).  Patient Active Problem List   Diagnosis Date Noted   Fetal echogenic intracardiac focus on prenatal ultrasound 11/05/2021   Supervision of high risk pregnancy, antepartum 10/01/2021   Short interval between pregnancies affecting pregnancy, antepartum 10/01/2021   History of prior pregnancy with IUGR newborn 10/01/2021     Assessment: Jasmine Buchanan is a 26 y.o. G3P1011 at [redacted]w[redacted]d here for labor  1. Labor: active 2. FWB: Cat I 3. Pain: analgesia/anesthesia prn 4. GBS: neg   Plan: Admit to LD   Jasmine Horn, NP  03/29/2022, 8:43 AM

## 2022-03-29 NOTE — Plan of Care (Signed)
  Problem: Education: Goal: Knowledge of General Education information will improve Description: Including pain rating scale, medication(s)/side effects and non-pharmacologic comfort measures Outcome: Progressing   Problem: Health Behavior/Discharge Planning: Goal: Ability to manage health-related needs will improve Outcome: Progressing   Problem: Clinical Measurements: Goal: Ability to maintain clinical measurements within normal limits will improve Outcome: Progressing Goal: Will remain free from infection Outcome: Progressing Goal: Diagnostic test results will improve Outcome: Progressing Goal: Respiratory complications will improve Outcome: Progressing Goal: Cardiovascular complication will be avoided Outcome: Progressing   Problem: Activity: Goal: Risk for activity intolerance will decrease Outcome: Progressing   Problem: Nutrition: Goal: Adequate nutrition will be maintained Outcome: Progressing   Problem: Coping: Goal: Level of anxiety will decrease Outcome: Progressing   Problem: Elimination: Goal: Will not experience complications related to bowel motility Outcome: Progressing Goal: Will not experience complications related to urinary retention Outcome: Progressing   Problem: Skin Integrity: Goal: Risk for impaired skin integrity will decrease Outcome: Progressing   Problem: Education: Goal: Knowledge of Childbirth will improve Outcome: Progressing Goal: Ability to make informed decisions regarding treatment and plan of care will improve Outcome: Progressing Goal: Ability to state and carry out methods to decrease the pain will improve Outcome: Progressing Goal: Individualized Educational Video(s) Outcome: Progressing   Problem: Life Cycle: Goal: Ability to make normal progression through stages of labor will improve Outcome: Progressing Goal: Ability to effectively push during vaginal delivery will improve Outcome: Progressing   Problem: Role  Relationship: Goal: Will demonstrate positive interactions with the child Outcome: Progressing   Problem: Safety: Goal: Risk of complications during labor and delivery will decrease Outcome: Progressing   Problem: Pain Management: Goal: Relief or control of pain from uterine contractions will improve Outcome: Progressing

## 2022-03-29 NOTE — Progress Notes (Signed)
Labor Progress Note Kita Neace is a 26 y.o. G3P1011 at [redacted]w[redacted]d who presented for SOL.  S: Doing well, no concerns.   O:  BP 130/71   Pulse (!) 106   Temp 98.1 F (36.7 C) (Oral)   Resp 17   Ht 5\' 3"  (1.6 m)   Wt 96 kg   LMP 06/24/2021 (Exact Date)   SpO2 99%   BMI 37.50 kg/m   EFM: Baseline 135 bpm, moderate variability, + accels, no decels  Toco: Every 4-5 minutes   CVE: Dilation: 6.5 Effacement (%): 80 Cervical Position: Middle Station: -1 Presentation: Vertex Exam by:: Lareen Mullings, MD  A&P: 26 y.o. 30 [redacted]w[redacted]d   #Labor: SVE largely unchanged from prior. Bulging bag noted with contractions. AROM performed with small amount of clear fluid. Mom and baby tolerated this well. Will plan to recheck in 2-3 hours. Plan to augment with Pitocin if unchanged on next exam.  #Pain: Epidural  #FWB: Cat 1  #GBS negative  [redacted]w[redacted]d, MD 1:58 PM

## 2022-03-29 NOTE — Progress Notes (Signed)
Labor Progress Note Jasmine Buchanan is a 26 y.o. G3P1011 at [redacted]w[redacted]d who presented for SOL.   S: Doing well. Feels some pressure here and there but nothing constant. No concerns.   O:  BP 125/61   Pulse (!) 120   Temp 98.1 F (36.7 C) (Oral)   Resp 17   Ht 5\' 3"  (1.6 m)   Wt 96 kg   LMP 06/24/2021 (Exact Date)   SpO2 99%   BMI 37.50 kg/m   EFM: Baseline 135 bpm, moderate variability, + accels, no decels  Toco: Every 2-4 minutes   CVE: Dilation: 9 Effacement (%): 90 Cervical Position: Middle Station: 0 Presentation: Vertex Exam by:: Dr. [email or web address]  A&P: 26 y.o. 30 [redacted]w[redacted]d   #Labor: Slow progression though doing well overall. Contraction pattern irregular every 2-4 minutes. Will add low dose Pitocin for augmentation. Will reassess in 2 hours, sooner as needed.  #Pain: Epidural  #FWB: Cat 1  #GBS negative  [redacted]w[redacted]d, MD 6:51 PM

## 2022-03-29 NOTE — Progress Notes (Signed)
Labor Progress Note Atalia Litzinger is a 26 y.o. G3P1011 at [redacted]w[redacted]d who presented for SOL.   S: Doing well. Feeling more discomfort in her back in certain positions. No concerns.   O:  BP (!) 118/53   Pulse 92   Temp 98.1 F (36.7 C) (Oral)   Resp 17   Ht 5\' 3"  (1.6 m)   Wt 96 kg   LMP 06/24/2021 (Exact Date)   SpO2 99%   BMI 37.50 kg/m   EFM: Baseline 120 bpm, moderate variability, + accels, no decels  Toco: Every 2-4 minutes   CVE: Dilation: 8 Effacement (%): 90 Cervical Position: Middle Station: 0 Presentation: Vertex Exam by:: Dr. [email or web address]  A&P: 26 y.o. 30 [redacted]w[redacted]d   #Labor: Progressing well. Fetal head feels OT. Will continue to work on position changes to help with further fetal descent and rotation into pelvis. Plan to reassess in 2 hours sooner as needed. Anticipate SVD.  #Pain: Epidural  #FWB: Cat 1  #GBS negative  [redacted]w[redacted]d, MD 4:57 PM

## 2022-03-29 NOTE — Lactation Note (Signed)
This note was copied from a baby's chart. Lactation Consultation Note  Patient Name: Boy Idabelle Mcpeters [id number] Date: 03/29/2022 Reason for consult: L&D Initial assessment Age:24 hours P2, term female infant. Per mom, she briefly BF 1st child who is 4 months old for 2 months. Mom attempted latch infant on her left breast using the football hold position infant only held nipple in mouth. Mom was taught hand expression LC used breast model and mom self expressed 6 mls of colostrum that was spoon fed to infant. Mom will continue to breastfeed infant according to hunger cues, on demand, 8 to 12+ times within 24 hours, skin to skin. Mom knows to ask RN/LC for further latch assistance if needed. LC congratulated parents on the birth of their son. Maternal Data Has patient been taught Hand Expression?: Yes  Feeding Mother's Current Feeding Choice: Breast Milk  LATCH Score Latch: Too sleepy or reluctant, no latch achieved, no sucking elicited.  Audible Swallowing: None  Type of Nipple: Everted at rest and after stimulation  Comfort (Breast/Nipple): Soft / non-tender  Hold (Positioning): Assistance needed to correctly position infant at breast and maintain latch.  LATCH Score: 5   Lactation Tools Discussed/Used    Interventions Interventions: Assisted with latch;Skin to skin;Hand express;Breast compression;Adjust position;Support pillows;Position options;Expressed milk;Education  Discharge    Consult Status Consult Status: Follow-up from L&D    Danelle Earthly 03/29/2022, 10:33 PM

## 2022-03-29 NOTE — Discharge Summary (Signed)
Postpartum Discharge Summary  Date of Service updated***     Patient Name: Jasmine Buchanan DOB: [birth year] MRN: [id number]  Date of admission: 03/29/2022 Delivery date:03/29/2022  Delivering provider: Renard Matter  Date of discharge: 03/29/2022  Admitting diagnosis: ctx Intrauterine pregnancy: [redacted]w[redacted]d     Secondary diagnosis:  Active Problems:   Supervision of high risk pregnancy, antepartum   Short interval between pregnancies affecting pregnancy, antepartum   History of prior pregnancy with IUGR newborn   Fetal echogenic intracardiac focus on prenatal ultrasound   Vaginal delivery  Additional problems: ***None    Discharge diagnosis: Term Pregnancy Delivered                                              Post partum procedures:*** Augmentation: AROM and Pitocin Complications: None  Hospital course: Onset of Labor With Vaginal Delivery      26 y.o. yo G3P1011 at [redacted]w[redacted]d was admitted in Active Labor on 03/29/2022. Patient had an uncomplicated labor course as follows:  Membrane Rupture Time/Date: 1:57 PM ,03/29/2022   Delivery Method:Vaginal, Spontaneous  Episiotomy: None  Lacerations:  None  Patient had an uncomplicated postpartum course.  She is ambulating, tolerating a regular diet, passing flatus, and urinating well. Patient is discharged home in stable condition on 03/29/22.  Newborn Data: Birth date:03/29/2022  Birth time:8:41 PM  Gender:Female  Living status:Living  Apgars:7 ,9  Weight:   Magnesium Sulfate received: No BMZ received: No Rhophylac:N/A MMR:N/A T-DaP:{Tdap:23962} Flu: {FMB:34037} Transfusion:{Transfusion received:30440034}  Physical exam  Vitals:   03/29/22 2020 03/29/22 2055 03/29/22 2101 03/29/22 2122  BP: 130/64 130/65 127/65 140/68  Pulse: (!) 129 (!) 124 (!) 132 (!) 120  Resp:      Temp:      TempSrc:      SpO2:      Weight:      Height:       General: {Exam; general:21111117} Lochia: {Desc;  appropriate/inappropriate:30686::"appropriate"} Uterine Fundus: {Desc; firm/soft:30687} Incision: {Exam; incision:21111123} DVT Evaluation: {Exam; QDU:4383818} Labs: Lab Results  Component Value Date   WBC 12.8 (H) 03/29/2022   HGB 12.5 03/29/2022   HCT 37.2 03/29/2022   MCV 93.9 03/29/2022   PLT 241 03/29/2022       No data to display         Edinburgh Score:     No data to display           After visit meds:  Allergies as of 03/29/2022   No Known Allergies   Med Rec must be completed prior to using this [facility]***        Discharge home in stable condition Infant Feeding: {Baby feeding:23562} Infant Disposition:{CHL IP OB HOME WITH MCRFVO:36067} Discharge instruction: per After Visit Summary and Postpartum booklet. Activity: Advance as tolerated. Pelvic rest for 6 weeks.  Diet: {OB PCHE:03524818} Future Appointments: Future Appointments  Date Time Provider [place]  03/31/2022  9:35 AM Donnamae Jude, MD [facility] None   Follow up Visit: Message sent to [facility] by Dr. Cy Blamer on 7/8  Please schedule this patient for a In person postpartum visit in 6 weeks with the following provider: Any provider. Additional Postpartum F/U: None   Low risk pregnancy complicated by:  None Delivery mode:  Vaginal, Spontaneous  Anticipated Birth Control:  Condoms   03/29/2022 Renard Matter, MD

## 2022-03-29 NOTE — Anesthesia Procedure Notes (Signed)
Epidural Patient location during procedure: OB Start time: 03/29/2022 10:05 AM End time: 03/29/2022 10:15 AM  Staffing Anesthesiologist: Elmer Picker, MD Performed: anesthesiologist   Preanesthetic Checklist Completed: patient identified, IV checked, risks and benefits discussed, monitors and equipment checked, pre-op evaluation and timeout performed  Epidural Patient position: sitting Prep: DuraPrep and site prepped and draped Patient monitoring: continuous pulse ox, blood pressure, heart rate and cardiac monitor Approach: midline Location: L3-L4 Injection technique: LOR air  Needle:  Needle type: Tuohy  Needle gauge: 17 G Needle length: 9 cm Needle insertion depth: 5 cm Catheter type: closed end flexible Catheter size: 19 Gauge Catheter at skin depth: 10 cm Test dose: negative  Assessment Sensory level: T8 Events: blood not aspirated, injection not painful, no injection resistance, no paresthesia and negative IV test  Additional Notes Patient identified. Risks/Benefits/Options discussed with patient including but not limited to bleeding, infection, nerve damage, paralysis, failed block, incomplete pain control, headache, blood pressure changes, nausea, vomiting, reactions to medication both or allergic, itching and postpartum back pain. Confirmed with bedside nurse the patient's most recent platelet count. Confirmed with patient that they are not currently taking any anticoagulation, have any bleeding history or any family history of bleeding disorders. Patient expressed understanding and wished to proceed. All questions were answered. Sterile technique was used throughout the entire procedure. Please see nursing notes for vital signs. Test dose was given through epidural catheter and negative prior to continuing to dose epidural or start infusion. Warning signs of high block given to the patient including shortness of breath, tingling/numbness in hands, complete motor block,  or any concerning symptoms with instructions to call for help. Patient was given instructions on fall risk and not to get out of bed. All questions and concerns addressed with instructions to call with any issues or inadequate analgesia.  Reason for block:procedure for pain

## 2022-03-29 NOTE — MAU Provider Note (Addendum)
Event Date/Time  First Provider Initiated Contact with Patient 03/29/22 [phone]     S: Ms. Tarisha Fader is a 26 y.o. G3P1011 at [redacted]w[redacted]d  who presents to MAU today complaining contractions q 4 minutes since 0100. She denies vaginal bleeding. She denies LOF. She reports normal fetal movement.    O: BP 137/82   Pulse 90   Temp 98.1 F (36.7 C) (Oral)   Resp 17   Ht 5\' 3"  (1.6 m)   Wt 96 kg   LMP 06/24/2021 (Exact Date)   SpO2 99%   BMI 37.50 kg/m  GENERAL: Well-developed, well-nourished female in no acute distress.  HEAD: Normocephalic, atraumatic.  CHEST: Normal effort of breathing, regular heart rate ABDOMEN: Soft, nontender, gravid  Cervical exam:  Dilation: 5.5 Effacement (%): 90 Cervical Position: Middle Station: -3 Presentation: Vertex Exam by:: [email or web address] NP  Fetal Monitoring: Baseline: 135 Variability: mod Accelerations: + Decelerations: no Contractions: 5-8  MDM: Therapeutic rest with Morphine and Zofran. Plan for re-evaluation in 1-2 hours.  Transfer of care to [facility], FNP [facility], CNM  03/29/2022 8:30 AM   A: Active labor  P: Admit to birthing suites   05/30/2022, NP

## 2022-03-29 NOTE — MAU Note (Signed)
.  Jasmine Buchanan is a 26 y.o. at [redacted]w[redacted]d here in MAU reporting: contractions that began at 0100. Denies SROM or vaginal bleeding or bloody show. Endorses + fetal movement. Denies complications with pregnancy. GBS-  Onset of complaint: 0100 Pain score: 8 Vitals:   03/29/22 0623  BP: 127/72  Pulse: 98  Resp: 17  Temp: 98.1 F (36.7 C)  SpO2: 99%     FHT:133bpm Lab orders placed from triage:  mau labor

## 2022-03-29 NOTE — Anesthesia Preprocedure Evaluation (Signed)
Anesthesia Evaluation  Patient identified by MRN, date of birth, ID band Patient awake    Reviewed: Allergy & Precautions, NPO status , Patient's Chart, lab work & pertinent test results  Airway Mallampati: II  TM Distance: >3 FB Neck ROM: Full    Dental no notable dental hx.    Pulmonary neg pulmonary ROS, former smoker,    Pulmonary exam normal breath sounds clear to auscultation       Cardiovascular negative cardio ROS Normal cardiovascular exam Rhythm:Regular Rate:Normal     Neuro/Psych negative neurological ROS  negative psych ROS   GI/Hepatic negative GI ROS, Neg liver ROS,   Endo/Other  negative endocrine ROS  Renal/GU negative Renal ROS  negative genitourinary   Musculoskeletal negative musculoskeletal ROS (+)   Abdominal   Peds  Hematology negative hematology ROS (+)   Anesthesia Other Findings   Reproductive/Obstetrics (+) Pregnancy                             Anesthesia Physical Anesthesia Plan  ASA: 2  Anesthesia Plan: Epidural   Post-op Pain Management:    Induction:   PONV Risk Score and Plan: Treatment may vary due to age or medical condition  Airway Management Planned: Natural Airway  Additional Equipment:   Intra-op Plan:   Post-operative Plan:   Informed Consent: I have reviewed the patients History and Physical, chart, labs and discussed the procedure including the risks, benefits and alternatives for the proposed anesthesia with the patient or authorized representative who has indicated his/her understanding and acceptance.       Plan Discussed with: Anesthesiologist  Anesthesia Plan Comments: (Patient identified. Risks, benefits, options discussed with patient including but not limited to bleeding, infection, nerve damage, paralysis, failed block, incomplete pain control, headache, blood pressure changes, nausea, vomiting, reactions to medication,  itching, and post partum back pain. Confirmed with bedside nurse the patient's most recent platelet count. Confirmed with the patient that they are not taking any anticoagulation, have any bleeding history or any family history of bleeding disorders. Patient expressed understanding and wishes to proceed. All questions were answered. )        Anesthesia Quick Evaluation

## 2022-03-29 NOTE — Plan of Care (Signed)

## 2022-03-30 LAB — CBC
HCT: 30.7 % — ABNORMAL LOW (ref 36.0–46.0)
Hemoglobin: 10.5 g/dL — ABNORMAL LOW (ref 12.0–15.0)
MCH: 32.4 pg (ref 26.0–34.0)
MCHC: 34.2 g/dL (ref 30.0–36.0)
MCV: 94.8 fL (ref 80.0–100.0)
Platelets: 200 10*3/uL (ref 150–400)
RBC: 3.24 MIL/uL — ABNORMAL LOW (ref 3.87–5.11)
RDW: 13.1 % (ref 11.5–15.5)
WBC: 19.1 10*3/uL — ABNORMAL HIGH (ref 4.0–10.5)
nRBC: 0 % (ref 0.0–0.2)

## 2022-03-30 MED ORDER — POLYETHYLENE GLYCOL 3350 17 G PO PACK
17.0000 g | PACK | Freq: Every day | ORAL | Status: DC
Start: 2022-03-30 — End: 2022-03-31
  Filled 2022-03-30 (×2): qty 1

## 2022-03-30 NOTE — Lactation Note (Signed)
This note was copied from a baby's chart. Lactation Consultation Note  Patient Name: Jasmine Buchanan [id number] Date: 03/30/2022 Reason for consult: Follow-up assessment;Term;Infant weight loss (-1% weight loss) Age:24 hours LC entered the room, infant was asleep, per mom, infant recently BF for 9 minutes at 1552. Per mom, infant is now starting to latch well at the breast, mom knows if infant is still cuing after offering him the 1st breast to offer 2nd breast during the same feeding. Mom knows how to hand express and give infant back her EBM if he doesn't latch. Mom will continue to breastfeed infant according to hunger cues, on demand, skin to skin. Mom will continue to call RN/LC if she need further assistance with latching infant at the breast.  Maternal Data    Feeding Mother's Current Feeding Choice: Breast Milk and Formula  LATCH Score                    Lactation Tools Discussed/Used    Interventions Interventions: Skin to skin;Position options;Expressed milk;Hand express;Education  Discharge    Consult Status Consult Status: Follow-up Date: 03/31/22 Follow-up type: In-patient    Danelle Earthly 03/30/2022, 5:24 PM

## 2022-03-30 NOTE — Progress Notes (Signed)
POSTPARTUM PROGRESS NOTE  Subjective: Jasmine Buchanan is a 26 y.o. [id number] PPD#1 s/p  SVD at [redacted]w[redacted]d.  She reports she doing well. No acute events overnight. She denies any problems with ambulating, voiding or po intake. Denies nausea or vomiting. She has not yet passed flatus. Pain is well controlled.  Lochia is scant.  Objective: Blood pressure 112/76, pulse 77, temperature 98 F (36.7 C), temperature source Oral, resp. rate 18, height 5\' 3"  (1.6 m), weight 96 kg, last menstrual period 06/24/2021, SpO2 98 %, unknown if currently breastfeeding.  Physical Exam:  General: alert, cooperative and no distress Chest: no respiratory distress Abdomen: soft, non-tender. Bowel sounds heard in all four quadrants on auscultation Uterine Fundus: firm, appropriately tender Extremities: No calf swelling or tenderness  no edema  Recent Labs    03/29/22 0846 03/30/22 0432  HGB 12.5 10.5*  HCT 37.2 30.7*    Assessment/Plan: Jasmine Buchanan is a 26 y.o. 24 PPD#1 s/p  SVD at [redacted]w[redacted]d.    Routine Postpartum Care: Doing well, pain well-controlled. No BM or flatus yet. Bowel signs active on abdominal exam. Add bowel regimen. -- Continue routine care, lactation support  -- Contraception: plans condoms -- Feeding: breast  Dispo: Plan for discharge PPD#2 as late delivery.  [redacted]w[redacted]d, MD, MPH OB Fellow, [facility] for [facility]

## 2022-03-30 NOTE — Lactation Note (Signed)
This note was copied from a baby's chart. Lactation Consultation Note  Patient Name: Jasmine Buchanan [id number] Date: 03/30/2022 Reason for consult: Initial assessment;Term Age:24 hours   P2 mother whose infant is now 60 hours old.  This is a term baby at 39+5 weeks.  Mother breast fed her first child (now 20 months old) for 2 months.  Mother interested in a lactation visit during the night.  Baby "Westley Gambles" had not fed in four hours.  Offered to awaken and assist with latching; mother receptive.  Taught hand expression; few drops expressed.  Attempted to latch, however, baby was too sleepy to open his mouth.  Demonstrated burping and waking; no interest.  Swaddled per mother's request and he fell asleep.  Mother requested a manual pump.  Provided a pump with instructions for use.  #21 flange size is appropriate.  Mother will use prn.  Emphasized the importance of STS, breast massage and hand expression.  Father present.   Maternal Data Has patient been taught Hand Expression?: Yes Does the patient have breastfeeding experience prior to this delivery?: Yes How long did the patient breastfeed?: 2 months  Feeding Mother's Current Feeding Choice: Breast Milk  LATCH Score Latch: Too sleepy or reluctant, no latch achieved, no sucking elicited.  Audible Swallowing: None  Type of Nipple: Everted at rest and after stimulation  Comfort (Breast/Nipple): Soft / non-tender  Hold (Positioning): Assistance needed to correctly position infant at breast and maintain latch.  LATCH Score: 5   Lactation Tools Discussed/Used Tools: Pump Breast pump type: Manual Pump Education: Setup, frequency, and cleaning;Milk Storage Reason for Pumping: Mother's request Pumping frequency: PRN  Interventions Interventions: Breast feeding basics reviewed;Assisted with latch;Skin to skin;Breast massage;Hand express;Breast compression;Hand pump;Position options;Support pillows;Adjust position;Education;LC  Services brochure  Discharge Pump: Manual WIC Program: Yes  Consult Status Consult Status: Follow-up Date: 03/31/22 Follow-up type: In-patient    Candice Lunney R Karmel Patricelli 03/30/2022, 5:52 AM

## 2022-03-30 NOTE — Anesthesia Postprocedure Evaluation (Signed)
Anesthesia Post Note  Patient: Jasmine Buchanan  Procedure(s) Performed: AN AD HOC LABOR EPIDURAL     Patient location during evaluation: Mother Baby Anesthesia Type: Epidural Level of consciousness: awake and alert and oriented Pain management: satisfactory to patient Vital Signs Assessment: post-procedure vital signs reviewed and stable Respiratory status: respiratory function stable Cardiovascular status: stable Postop Assessment: no headache, no backache, epidural receding, patient able to bend at knees, no signs of nausea or vomiting, adequate PO intake and able to ambulate Anesthetic complications: no   No notable events documented.  Last Vitals:  Vitals:   03/30/22 0345 03/30/22 0830  BP: 124/65 112/76  Pulse: 79 77  Resp: 20 18  Temp: 36.8 C 36.7 C  SpO2: 99% 98%    Last Pain:  Vitals:   03/30/22 0830  TempSrc: Oral  PainSc: 6    Pain Goal: Patients Stated Pain Goal: 0 (03/29/22 0924)                 Karleen Dolphin

## 2022-03-31 ENCOUNTER — Other Ambulatory Visit (HOSPITAL_COMMUNITY): Payer: Self-pay

## 2022-03-31 ENCOUNTER — Encounter: Payer: Medicaid Other | Admitting: Family Medicine

## 2022-03-31 MED ORDER — ACETAMINOPHEN 325 MG PO TABS
650.0000 mg | ORAL_TABLET | ORAL | 0 refills | Status: DC | PRN
  Filled 2022-03-31: qty 60, 5d supply, fill #0

## 2022-03-31 MED ORDER — OXYCODONE HCL 5 MG PO TABS
5.0000 mg | ORAL_TABLET | Freq: Four times a day (QID) | ORAL | 0 refills | Status: DC | PRN
  Filled 2022-03-31: qty 5, 2d supply, fill #0

## 2022-03-31 MED ORDER — IBUPROFEN 600 MG PO TABS
600.0000 mg | ORAL_TABLET | Freq: Four times a day (QID) | ORAL | 0 refills | Status: DC
  Filled 2022-03-31: qty 30, 8d supply, fill #0

## 2022-03-31 NOTE — Procedures (Deleted)
Circumcision Procedure Note  Preprocedural Diagnoses: Parental desire for neonatal circumcision, normal female phallus, prophylaxis against HIV infection and other infections (ICD10 Z29.8)  Postprocedural Diagnoses:  The same. Status post routine circumcision  Procedure: Neonatal Circumcision using Gomco  Proceduralist: Raylene Everts, MD  Preprocedural Counseling: Parent desires circumcision for this female infant.  Circumcision procedure details discussed, risks and benefits of procedure were also discussed.  The benefits include but are not limited to: reduction in the rates of urinary tract infection (UTI), penile cancer, sexually transmitted infections including HIV, penile inflammatory and retractile disorders.  Circumcision also helps obtain better and easier hygiene of the penis.  Risks include but are not limited to: bleeding, infection, injury of glans which may lead to penile deformity or urinary tract issues or Urology intervention, unsatisfactory cosmetic appearance and other potential complications related to the procedure.  It was emphasized that this is an elective procedure.  Written informed consent was obtained.  Anesthesia: 1% lidocaine local  EBL: Minimal  Complications: None immediate  Procedure Details:  A timeout was performed and the infant's identify verified prior to starting the procedure. The infant was laid in a supine position, and an alcohol prep was done.  A dorsal penile nerve block was performed with 1% lidocaine. The area was then cleaned with betadine and draped in sterile fashion.   Gomco Two hemostats are applied at the 2 o'clock and 10 o'clock positions on the foreskin.  While maintaining traction, a third hemostat was used to sweep around the glans the release adhesions between the glans and the inner layer of mucosa avoiding the 5 o'clock and 7 o'clock positions.   The hemostat was then placed at the 12 o'clock position in the midline.  The hemostat was  then removed and scissors were used to cut along the crushed skin to its most proximal point.   The foreskin was then retracted over the glans removing any additional adhesions with blunt dissection or probe.  The foreskin was then placed back over the glans and a 1.3 Gomco bell was inserted over the glans.  The two hemostats were removed and a third hemostat was placed to hold the foreskin and underlying mucosa.  The incision was guided above the base plate of the Gomco.  The clamp was attached and tightened until the foreskin was crushed between the bell and the base plate.  This was held in place for 5 minutes with excision of the foreskin atop the base plate with the scalpel.  The excised foreskin was removed and discarded per hospital protocol.  The thumbscrew was then loosened, base plate removed and then bell removed with gentle traction.  The area was inspected and found to be hemostatic.  A strip of gelfoam was then applied to the cut edge of the foreskin.   The patient tolerated procedure well.  Routine post circumcision orders were placed; patient will receive routine post circumcision and nursery care.    Raylene Everts, MD   This procedure was completed with my supervising physician, Dr. Leticia Penna.

## 2022-03-31 NOTE — Lactation Note (Signed)
This note was copied from a baby's chart. Lactation Consultation Note  Patient Name: Jasmine Buchanan Date: 03/31/2022 Reason for consult: Follow-up assessment;Term Age:24 hours  P2 mother whose infant is now 58 hours old.  This is a term baby at 39+5 weeks.  Mother's feeding preference is breast.  "Jasmine Buchanan" has been latching and breast feeding well per mother.  She has no difficulty with latching and feels good uterine contractions during feedings.  She continues to hand express.  Mother had some basic questions related to breast feeding and milk storage; reviewed with mother.  Mother has our OP phone number for any further questions after discharge.  Family is awaiting an [place] and also for the RN to complete the circumcision checks prior to discharge.  Father present.   Maternal Data    Feeding    LATCH Score                    Lactation Tools Discussed/Used    Interventions Interventions: Education  Discharge Discharge Education: Engorgement and breast care Pump: Personal  Consult Status Consult Status: Complete Date: 03/31/22 Follow-up type: Call as needed    Irene Pap Chancelor Hardrick 03/31/2022, 2:31 PM

## 2022-04-01 ENCOUNTER — Inpatient Hospital Stay (HOSPITAL_COMMUNITY): Admission: AD | Admit: 2022-04-01 | Payer: Medicaid Other | Source: Home / Self Care | Admitting: Family Medicine

## 2022-04-01 ENCOUNTER — Inpatient Hospital Stay (HOSPITAL_COMMUNITY): Payer: Medicaid Other

## 2022-04-01 ENCOUNTER — Encounter (HOSPITAL_COMMUNITY): Payer: Medicaid Other

## 2022-04-07 ENCOUNTER — Telehealth (HOSPITAL_COMMUNITY): Payer: Self-pay | Admitting: *Deleted

## 2022-04-07 NOTE — Telephone Encounter (Signed)
Patient voiced no questions or concerns regarding her health at this time. EPDS=7. Patient voiced no questions or concerns regarding infant at this time. Patient reports infant sleeps in a bassinet on his back. RN reviewed ABCs of safe sleep. Patient verbalized understanding. Patient requested RN email information on hospital's virtual postpartum classes and support groups. Email sent. Deforest Hoyles, RN, 04/07/22, [phone]

## 2022-05-07 ENCOUNTER — Ambulatory Visit: Payer: Medicaid Other | Admitting: Family Medicine

## 2022-05-14 ENCOUNTER — Encounter: Payer: Self-pay | Admitting: Family Medicine

## 2022-05-14 ENCOUNTER — Ambulatory Visit (INDEPENDENT_AMBULATORY_CARE_PROVIDER_SITE_OTHER): Payer: Medicaid Other | Admitting: Family Medicine

## 2022-05-14 NOTE — Progress Notes (Signed)
Post Partum Visit Note  Jasmine Buchanan is a 26 y.o. G57P2012 female who presents for a postpartum visit. She is 5 weeks postpartum following a normal spontaneous vaginal delivery.  I have fully reviewed the prenatal and intrapartum course. The delivery was at 39.5 gestational weeks.  Anesthesia: epidural. Postpartum course has been normal. Baby is doing well. Baby is feeding by  [employer] . Bleeding no bleeding. Bowel function is normal. Bladder function is normal. Patient is sexually active. Contraception method is none. Postpartum depression screening: negative.   The pregnancy intention screening data noted above was reviewed. Potential methods of contraception were discussed. The patient elected to proceed with No data recorded.   Edinburgh Postnatal Depression Scale - 05/14/22 1114       Edinburgh Postnatal Depression Scale:  In the Past 7 Days   I have been able to laugh and see the funny side of things. 0    I have looked forward with enjoyment to things. 0    I have blamed myself unnecessarily when things went wrong. 1    I have been anxious or worried for no good reason. 1    I have felt scared or panicky for no good reason. 1    Things have been getting on top of me. 2    I have been so unhappy that I have had difficulty sleeping. 0    I have felt sad or miserable. 1    I have been so unhappy that I have been crying. 1    The thought of harming myself has occurred to me. 0    Edinburgh Postnatal Depression Scale Total 7             Health Maintenance Due  Topic Date Due   COVID-19 Vaccine (1) Never done   HPV VACCINES (1 - 2-dose series) Never done   TETANUS/TDAP  Never done   PAP-Cervical Cytology Screening  Never done   PAP SMEAR-Modifier  Never done   INFLUENZA VACCINE  04/22/2022    The following portions of the patient's history were reviewed and updated as appropriate: allergies, current medications, past family history, past medical history, past  social history, past surgical history, and problem list.  Review of Systems Pertinent items are noted in HPI.  Objective:  BP (!) 105/50   Pulse 65   Wt 185 lb (83.9 kg)   LMP 06/24/2021 (Exact Date)   Breastfeeding No   BMI 32.77 kg/m    General:  alert, cooperative, and no distress   Breasts:  not indicated  Lungs: clear to auscultation bilaterally  Heart:  regular rate and rhythm, S1, S2 normal, no murmur, click, rub or gallop  Abdomen: soft, non-tender; bowel sounds normal; no masses,  no organomegaly   Wound N/a  GU exam:  not indicated       Assessment:    1. Postpartum exam   Plan:   Essential components of care per ACOG recommendations:  1.  Mood and well being: Patient with negative depression screening today. Reviewed local resources for support.  - Patient tobacco use? No.   - hx of drug use? No.    2. Infant care and feeding:  -Patient currently breastmilk feeding? No.  -Social determinants of health (SDOH) reviewed in EPIC. No concerns  3. Sexuality, contraception and birth spacing - Patient does not want a pregnancy in the next year.  - Reviewed reproductive life planning. Reviewed contraceptive methods based on pt preferences and  effectiveness.  Patient desired Female Condom today.   - Discussed birth spacing of 18 months  4. Sleep and fatigue -Encouraged family/partner/community support of 4 hrs of uninterrupted sleep to help with mood and fatigue  5. Physical Recovery  - Discussed patients delivery and complications. She describes her labor as good. - Patient had a Vaginal, no problems at delivery.  Perineal healing reviewed. Patient expressed understanding - Patient has urinary incontinence? No. - Patient is safe to resume physical and sexual activity  6.  Health Maintenance - HM due items addressed Yes - Last pap smear No results found for: "DIAGPAP" Pap smear not done at today's visit.  -Breast Cancer screening indicated? No.   7. Chronic  Disease/Pregnancy Condition follow up: None  - PCP follow up  Levie Heritage, DO [facility], [facility]

## 2022-07-16 ENCOUNTER — Ambulatory Visit: Payer: Medicaid Other | Admitting: Family Medicine

## 2022-11-10 NOTE — Progress Notes (Signed)
HPI: Jasmine Buchanan is a 27 y.o. female, who is here today to establish care.  Former PCP: N/A Last preventive routine visit: Not sure. Gyn preventive care up to date.  She is concerned about episodes of vaginal bleeding, she is unsure if they are her menstrual period or caused by the use of Plan B, which she takes after sex intercourse as birth control.  She reports that her usual menstrual period lasts for four to five days, but she has been experiencing bleeding for only three days usually after taking Plan B.  In the past OCP's have caused/exacerbated depression.  She also expresses concerns about "postpartum" depression, as she has been feeling irritable and short-tempered since the birth of her last child in July/2023. She has a past history of depression but has not taken any medications for it.  She reports difficulty sleeping and feeling sad quite often.  Sleeps about 4-5 hours. She has helped with her children sometimes, she has a 1.25 yo and 40 months old.  Edinburgh Postnatal Depression Scale - 11/11/22 1529       Edinburgh Postnatal Depression Scale:  In the Past 7 Days   I have been able to laugh and see the funny side of things. 1    I have looked forward with enjoyment to things. 2    I have blamed myself unnecessarily when things went wrong. 1    I have been anxious or worried for no good reason. 0    I have felt scared or panicky for no good reason. 0    Things have been getting on top of me. 3    I have been so unhappy that I have had difficulty sleeping. 3    I have felt sad or miserable. 2    I have been so unhappy that I have been crying. 3    The thought of harming myself has occurred to me. 1    Edinburgh Postnatal Depression Scale Total 16            She also mentions shedding a lot of hair postpartum and is interested in knowing if there are any vitamins that can help with this issue. Negative for alopecic area or scalp lesions.  She has a history of  anemia during her pregnancy. She is not on iron supplementation. She is not breast feeding.  Lab Results  Component Value Date   WBC 19.1 (H) 03/30/2022   HGB 10.5 (L) 03/30/2022   HCT 30.7 (L) 03/30/2022   MCV 94.8 03/30/2022   PLT 200 03/30/2022   Review of Systems  Constitutional:  Negative for activity change, appetite change, chills and fever.  HENT:  Negative for mouth sores, nosebleeds and trouble swallowing.   Eyes:  Negative for redness and visual disturbance.  Respiratory:  Negative for cough, shortness of breath and wheezing.   Cardiovascular:  Negative for chest pain, palpitations and leg swelling.  Gastrointestinal:  Negative for abdominal pain, nausea and vomiting.       Negative for changes in bowel habits.  Endocrine: Negative for cold intolerance and heat intolerance.  Genitourinary:  Negative for decreased urine volume, dysuria and hematuria.  Neurological:  Negative for syncope, weakness and headaches.  Psychiatric/Behavioral:  Positive for sleep disturbance. Negative for confusion and hallucinations.   See other pertinent positives and negatives in HPI.  No current outpatient medications on file prior to visit.   No current facility-administered medications on file prior to visit.   Past Medical History:  Diagnosis Date   Medical history non-contributory    No Known Allergies  Family History  Problem Relation Age of Onset   Kidney disease Father    Breast cancer Paternal Grandmother    Social History   Socioeconomic History   Marital status: Single    Spouse name: Not on file   Number of children: Not on file   Years of education: Not on file   Highest education level: Not on file  Occupational History   Not on file  Tobacco Use   Smoking status: Former    Types: Cigarettes    Quit date: 07/23/2021    Years since quitting: 1.3   Smokeless tobacco: Never  Vaping Use   Vaping Use: Never used  Substance and Sexual Activity   Alcohol use: Not  Currently   Drug use: Never   Sexual activity: Yes    Partners: Male  Other Topics Concern   Not on file  Social History Narrative   Not on file   Social Determinants of Health   Financial Resource Strain: Not on file  Food Insecurity: Not on file  Transportation Needs: Not on file  Physical Activity: Not on file  Stress: Not on file  Social Connections: Not on file   Today's Vitals   11/11/22 1506  BP: 120/70  Pulse: 70  Resp: 12  Temp: 98.4 F (36.9 C)  TempSrc: Oral  SpO2: 98%  Weight: 161 lb 2 oz (73.1 kg)  Height: '5\' 3"'$  (1.6 m)   Body mass index is 28.54 kg/m.  Physical Exam Vitals and nursing note reviewed.  Constitutional:      General: She is not in acute distress.    Appearance: She is well-developed.  HENT:     Head: Normocephalic and atraumatic.     Mouth/Throat:     Mouth: Mucous membranes are moist.     Pharynx: Oropharynx is clear.  Eyes:     Conjunctiva/sclera: Conjunctivae normal.  Neck:     Thyroid: No thyroid mass.  Cardiovascular:     Rate and Rhythm: Normal rate and regular rhythm.     Heart sounds: No murmur heard. Pulmonary:     Effort: Pulmonary effort is normal. No respiratory distress.     Breath sounds: Normal breath sounds.  Abdominal:     Palpations: Abdomen is soft. There is no hepatomegaly or mass.     Tenderness: There is no abdominal tenderness.  Lymphadenopathy:     Cervical: No cervical adenopathy.  Skin:    General: Skin is warm.     Findings: No erythema or rash.  Neurological:     General: No focal deficit present.     Mental Status: She is alert and oriented to person, place, and time.     Cranial Nerves: No cranial nerve deficit.     Gait: Gait normal.  Psychiatric:        Mood and Affect: Mood and affect normal.   ASSESSMENT AND PLAN: Jasmine Buchanan was seen today for establish care.  Diagnoses and all orders for this visit: Lab Results  Component Value Date   WBC 7.9 11/11/2022   HGB 14.1 11/11/2022   HCT 42.3  11/11/2022   MCV 92.5 11/11/2022   PLT 289.0 11/11/2022   Lab Results  Component Value Date   TSH 0.01 (L) 11/11/2022   Iron deficiency anemia, unspecified iron deficiency anemia type During pregnancy. She is not on iron supplementation. Further recommendations according to CBC results.  -  CBC  Moderate episode of recurrent major depressive disorder (HCC) Aggravated during post partum. CBT recommended, information about providers in the are given. She agrees with trying Sertraline 25 mg daily. Side effects discussed. Instructed about warning signs. F/U in 6 weeks, before if needed.  -     sertraline (ZOLOFT) 25 MG tablet; Take 1 tablet (25 mg total) by mouth daily.  DUB (dysfunctional uterine bleeding) Possible etiologies discussed. Plan B treatments could be a continuing factor. Pregnancy test today negative. Continue following with gyn.  -     TSH -     POCT urine pregnancy  Encounter for counseling regarding contraception Explained that plan B is not intended for birth control. We discussed other options, including progesterone only contraception, implant,and IUD. She is not interested in any birth control method discussed today.  Return in about 6 weeks (around 12/23/2022).  Jasmine Buchanan G. [place], MD  [facility]. [place] office.

## 2022-11-11 ENCOUNTER — Encounter: Payer: Self-pay | Admitting: Family Medicine

## 2022-11-11 ENCOUNTER — Ambulatory Visit (INDEPENDENT_AMBULATORY_CARE_PROVIDER_SITE_OTHER): Payer: Medicaid Other | Admitting: Family Medicine

## 2022-11-11 VITALS — BP 120/70 | HR 70 | Temp 98.4°F | Resp 12 | Ht 63.0 in | Wt 161.1 lb

## 2022-11-11 DIAGNOSIS — D509 Iron deficiency anemia, unspecified: Secondary | ICD-10-CM

## 2022-11-11 DIAGNOSIS — F331 Major depressive disorder, recurrent, moderate: Secondary | ICD-10-CM

## 2022-11-11 DIAGNOSIS — Z3009 Encounter for other general counseling and advice on contraception: Secondary | ICD-10-CM

## 2022-11-11 DIAGNOSIS — N938 Other specified abnormal uterine and vaginal bleeding: Secondary | ICD-10-CM | POA: Diagnosis not present

## 2022-11-11 LAB — POCT URINE PREGNANCY: Preg Test, Ur: NEGATIVE

## 2022-11-11 MED ORDER — SERTRALINE HCL 25 MG PO TABS: 25.0000 mg | ORAL_TABLET | Freq: Every day | ORAL | 1 refills | Status: DC

## 2022-11-11 NOTE — Patient Instructions (Signed)
A few things to remember from today's visit:  Moderate episode of recurrent major depressive disorder ([place]) - Plan: sertraline (ZOLOFT) 25 MG tablet  DUB (dysfunctional uterine bleeding) - Plan: TSH  Iron deficiency anemia, unspecified iron deficiency anemia type - Plan: CBC  If you need refills for medications you take chronically, please call your pharmacy. Do not use My Chart to request refills or for acute issues that need immediate attention. If you send a my chart message, it may take a few days to be addressed, specially if I am not in the office.  Please be sure medication list is accurate. If a new problem present, please set up appointment sooner than planned today.

## 2022-11-12 LAB — CBC
HCT: 42.3 % (ref 36.0–46.0)
Hemoglobin: 14.1 g/dL (ref 12.0–15.0)
MCHC: 33.3 g/dL (ref 30.0–36.0)
MCV: 92.5 fl (ref 78.0–100.0)
Platelets: 289 10*3/uL (ref 150.0–400.0)
RBC: 4.57 Mil/uL (ref 3.87–5.11)
RDW: 11.8 % (ref 11.5–15.5)
WBC: 7.9 10*3/uL (ref 4.0–10.5)

## 2022-11-12 LAB — TSH: TSH: 0.01 u[IU]/mL — ABNORMAL LOW (ref 0.35–5.50)

## 2022-12-22 NOTE — Progress Notes (Unsigned)
HPI: Jasmine Buchanan is a 27 y.o. female, who is here today to follow on recent visit.current medication and to discuss contraceptive options. She reports having been on desipramine 25 mg for anxiety over the past four weeks and notes a slight improvement in symptoms, including a clearer mind and increased energy. The patient expresses a desire to increase the dosage for potentially better results.  Regarding her thyroid health, the patient indicates that previous tests revealed an abnormal thyroid function. She confirms that she is not taking any biotin or multivitamins and has not experienced any pain in the thyroid area. Additionally, the patient's recent anemia test results were within normal limits.  The patient also discusses her reproductive health, noting an irregular menstrual cycle. She is considering various birth control methods and expresses interest in the copper IUD. However, she wishes to take a pregnancy test prior to making a decision. The patient has a history of using Depo-Provera shots, which she reports led to weight gain and depression.  Currently, the patient is using condoms as a contraceptive measure. She has expressed concerns about the potential long-term effects of IUDs on future pregnancies, specifically questioning the health of the baby and the risk of preterm birth.    12/23/2022    4:33 PM 12/23/2021    8:54 AM 10/01/2021    8:41 AM  Depression screen PHQ 2/9  Decreased Interest 1 0 0  Down, Depressed, Hopeless 1 0 0  PHQ - 2 Score 2 0 0  Altered sleeping 0 0 0  Tired, decreased energy 2 0 2  Change in appetite 2 0 0  Feeling bad or failure about yourself  0 0 0  Trouble concentrating 0 0 0  Moving slowly or fidgety/restless 0 0 0  Suicidal thoughts 0 0 0  PHQ-9 Score 6 0 2  Difficult doing work/chores Very difficult     Lab Results  Component Value Date   TSH 0.01 (L) 11/11/2022     Review of Systems See other pertinent positives and negatives in  HPI.  No current outpatient medications on file prior to visit.   No current facility-administered medications on file prior to visit.    Past Medical History:  Diagnosis Date   Depression    Medical history non-contributory    No Known Allergies  Social History   Socioeconomic History   Marital status: Single    Spouse name: Not on file   Number of children: Not on file   Years of education: Not on file   Highest education level: Not on file  Occupational History   Not on file  Tobacco Use   Smoking status: Former    Types: Cigarettes    Quit date: 07/23/2021    Years since quitting: 1.4   Smokeless tobacco: Never  Vaping Use   Vaping Use: Never used  Substance and Sexual Activity   Alcohol use: Not Currently   Drug use: Never   Sexual activity: Yes    Partners: Male  Other Topics Concern   Not on file  Social History Narrative   Not on file   Social Determinants of Health   Financial Resource Strain: Not on file  Food Insecurity: Not on file  Transportation Needs: Not on file  Physical Activity: Not on file  Stress: Not on file  Social Connections: Not on file    Vitals:   12/23/22 1529  BP: 118/70  Pulse: 79  Resp: 12  SpO2: 98%   Body mass  index is 28.59 kg/m.  Physical Exam Vitals and nursing note reviewed.  Constitutional:      General: She is not in acute distress.    Appearance: She is well-developed.  HENT:     Head: Normocephalic and atraumatic.  Eyes:     Conjunctiva/sclera: Conjunctivae normal.  Cardiovascular:     Rate and Rhythm: Normal rate and regular rhythm.     Heart sounds: No murmur heard. Pulmonary:     Effort: Pulmonary effort is normal. No respiratory distress.     Breath sounds: Normal breath sounds.  Skin:    General: Skin is warm.     Findings: No erythema or rash.  Neurological:     General: No focal deficit present.     Mental Status: She is alert and oriented to person, place, and time.     Gait: Gait  normal.  Psychiatric:        Speech: Speech normal.        Thought Content: Thought content does not include suicidal ideation. Thought content does not include suicidal plan.   ASSESSMENT AND PLAN:  Tiare was seen today for medical management of chronic issues.  Diagnoses and all orders for this visit:  Moderate episode of recurrent major depressive disorder -     sertraline (ZOLOFT) 50 MG tablet; Take 1 tablet (50 mg total) by mouth daily.  Encounter for counseling regarding contraception -     Ambulatory referral to Gynecology -     POCT urine pregnancy  Hyperthyroidism -     T4, free; Future -     Thyrotropin receptor autoabs; Future -     TSH; Future    Orders Placed This Encounter  Procedures   T4, free   Thyrotropin receptor autoabs   TSH   Ambulatory referral to Gynecology   POCT urine pregnancy    No problem-specific Assessment & Plan notes found for this encounter.   Return in about 3 months (around 03/24/2023).  Jemario Poitras G. [place], MD  [facility]. [place] office.

## 2022-12-23 ENCOUNTER — Encounter: Payer: Self-pay | Admitting: Family Medicine

## 2022-12-23 ENCOUNTER — Ambulatory Visit (INDEPENDENT_AMBULATORY_CARE_PROVIDER_SITE_OTHER): Payer: Medicaid Other | Admitting: Family Medicine

## 2022-12-23 VITALS — BP 118/70 | HR 79 | Resp 12 | Ht 63.0 in | Wt 161.4 lb

## 2022-12-23 DIAGNOSIS — E0590 Thyrotoxicosis, unspecified without thyrotoxic crisis or storm: Secondary | ICD-10-CM

## 2022-12-23 DIAGNOSIS — Z3009 Encounter for other general counseling and advice on contraception: Secondary | ICD-10-CM | POA: Diagnosis not present

## 2022-12-23 DIAGNOSIS — F331 Major depressive disorder, recurrent, moderate: Secondary | ICD-10-CM | POA: Diagnosis not present

## 2022-12-23 DIAGNOSIS — F419 Anxiety disorder, unspecified: Secondary | ICD-10-CM

## 2022-12-23 DIAGNOSIS — F332 Major depressive disorder, recurrent severe without psychotic features: Secondary | ICD-10-CM | POA: Insufficient documentation

## 2022-12-23 LAB — POCT URINE PREGNANCY: Preg Test, Ur: NEGATIVE

## 2022-12-23 MED ORDER — SERTRALINE HCL 50 MG PO TABS: 50.0000 mg | ORAL_TABLET | Freq: Every day | ORAL | 0 refills | Status: DC

## 2022-12-23 NOTE — Patient Instructions (Addendum)
A few things to remember from today's visit:  Moderate episode of recurrent major depressive disorder - Plan: sertraline (ZOLOFT) 50 MG tablet  Encounter for counseling regarding contraception - Plan: Ambulatory referral to Gynecology  Sertraline increased from 25 mg to 50 mg daily. Please arrange a lab appt in the morning, fasting. Stop any multivitamin or supplement 2 weeks before lab.  If you need refills for medications you take chronically, please call your pharmacy. Do not use My Chart to request refills or for acute issues that need immediate attention. If you send a my chart message, it may take a few days to be addressed, specially if I am not in the office.  Please be sure medication list is accurate. If a new problem present, please set up appointment sooner than planned today.

## 2022-12-25 NOTE — Assessment & Plan Note (Signed)
Reporting improvement with Sertraline, dose increased today from 25 mg to 50 mg daily. Recommend considering CBT. F/U in 3 months, before if needed.

## 2022-12-25 NOTE — Assessment & Plan Note (Addendum)
Mild improvement. Sertraline increased from 25 mg to 50 mg daily. Will consider adding Abilify or Lamictal if symptoms are persistent. Some side effects discussed. Instructed about warning signs. F/U in 3 months, before if needed.

## 2022-12-31 ENCOUNTER — Other Ambulatory Visit: Payer: Medicaid Other

## 2023-01-02 ENCOUNTER — Other Ambulatory Visit: Payer: Medicaid Other

## 2023-02-02 ENCOUNTER — Other Ambulatory Visit: Payer: Medicaid Other

## 2023-02-18 ENCOUNTER — Telehealth: Payer: Medicaid Other | Admitting: Physician Assistant

## 2023-02-18 DIAGNOSIS — R062 Wheezing: Secondary | ICD-10-CM

## 2023-02-18 DIAGNOSIS — J029 Acute pharyngitis, unspecified: Secondary | ICD-10-CM | POA: Diagnosis not present

## 2023-02-18 MED ORDER — IBUPROFEN 600 MG PO TABS: 600.0000 mg | ORAL_TABLET | Freq: Three times a day (TID) | ORAL | 0 refills | Status: DC | PRN

## 2023-02-18 MED ORDER — ALBUTEROL SULFATE HFA 108 (90 BASE) MCG/ACT IN AERS: 1.0000 | INHALATION_SPRAY | Freq: Four times a day (QID) | RESPIRATORY_TRACT | 0 refills | Status: AC | PRN

## 2023-02-18 NOTE — Addendum Note (Signed)
Addended by: Margaretann Loveless on: 02/18/2023 07:40 AM   Modules accepted: Orders

## 2023-02-18 NOTE — Progress Notes (Signed)
E-Visit for Sore Throat  We are sorry that you are not feeling well.  Here is how we plan to help!  Your symptoms indicate a likely viral infection (Pharyngitis).   Pharyngitis is inflammation in the back of the throat which can cause a sore throat, scratchiness and sometimes difficulty swallowing.   Pharyngitis is typically caused by a respiratory virus and will just run its course.  Please keep in mind that your symptoms could last up to 10 days.  For throat pain, we recommend over the counter oral pain relief medications such as acetaminophen or aspirin, or anti-inflammatory medications such as ibuprofen or naproxen sodium.  I will prescribe Ibuprofen 600mg  Take 1 tablet every 8 hours as needed. Alternate with Tylenol. Topical treatments such as oral throat lozenges or sprays may be used as needed.  Avoid close contact with loved ones, especially the very young and elderly.  Remember to wash your hands thoroughly throughout the day as this is the number one way to prevent the spread of infection and wipe down door knobs and counters with disinfectant.  After careful review of your answers, I would not recommend an antibiotic for your condition.  Antibiotics should not be used to treat conditions that we suspect are caused by viruses like the virus that causes the common cold or flu. However, some people can have Strep with atypical symptoms. You may need formal testing in clinic or office to confirm if your symptoms continue or worsen.  Providers prescribe antibiotics to treat infections caused by bacteria. Antibiotics are very powerful in treating bacterial infections when they are used properly.  To maintain their effectiveness, they should be used only when necessary.  Overuse of antibiotics has resulted in the development of super bugs that are resistant to treatment!    Home Care: Only take medications as instructed by your medical team. Do not drink alcohol while taking these medications. A  steam or ultrasonic humidifier can help congestion.  You can place a towel over your head and breathe in the steam from hot water coming from a faucet. Avoid close contacts especially the very young and the elderly. Cover your mouth when you cough or sneeze. Always remember to wash your hands.  Get Help Right Away If: You develop worsening fever or throat pain. You develop a severe head ache or visual changes. Your symptoms persist after you have completed your treatment plan.  Make sure you Understand these instructions. Will watch your condition. Will get help right away if you are not doing well or get worse.   Thank you for choosing an e-visit.  Your e-visit answers were reviewed by a board certified advanced clinical practitioner to complete your personal care plan. Depending upon the condition, your plan could have included both over the counter or prescription medications.  Please review your pharmacy choice. Make sure the pharmacy is open so you can pick up prescription now. If there is a problem, you may contact your provider through [employer] and have the prescription routed to another pharmacy.  Your safety is important to [place]. If you have drug allergies check your prescription carefully.   For the next 24 hours you can use MyChart to ask questions about today's visit, request a non-urgent call back, or ask for a work or school excuse. You will get an email in the next two days asking about your experience. I hope that your e-visit has been valuable and will speed your recovery.  I have spent 5  minutes in review of e-visit questionnaire, review and updating patient chart, medical decision making and response to patient.   Margaretann Loveless, PA-C

## 2023-02-23 ENCOUNTER — Other Ambulatory Visit: Payer: Medicaid Other

## 2023-02-24 ENCOUNTER — Other Ambulatory Visit: Payer: Medicaid Other

## 2023-03-12 ENCOUNTER — Other Ambulatory Visit (INDEPENDENT_AMBULATORY_CARE_PROVIDER_SITE_OTHER): Payer: Medicaid Other

## 2023-03-12 DIAGNOSIS — E0590 Thyrotoxicosis, unspecified without thyrotoxic crisis or storm: Secondary | ICD-10-CM | POA: Diagnosis not present

## 2023-03-12 LAB — TSH: TSH: <0.01 u[IU]/mL — ABNORMAL LOW (ref 0.35–5.50)

## 2023-03-12 LAB — T4, FREE: Free T4: 1.9 ng/dL — ABNORMAL HIGH (ref 0.60–1.60)

## 2023-03-13 LAB — THYROTROPIN RECEPTOR AUTOABS: Thyrotropin Receptor Ab: 13.7 IU/L — ABNORMAL HIGH (ref 0.00–1.75)

## 2023-03-15 ENCOUNTER — Other Ambulatory Visit: Payer: Self-pay | Admitting: Family Medicine

## 2023-03-15 DIAGNOSIS — E0590 Thyrotoxicosis, unspecified without thyrotoxic crisis or storm: Secondary | ICD-10-CM

## 2023-03-18 ENCOUNTER — Ambulatory Visit
Admission: RE | Admit: 2023-03-18 | Discharge: 2023-03-18 | Disposition: A | Discharge status: Home / Self Care | Payer: Medicaid Other | Source: Ambulatory Visit | Attending: Family Medicine | Admitting: Family Medicine

## 2023-03-18 ENCOUNTER — Ambulatory Visit: Payer: Medicaid Other | Admitting: Family Medicine

## 2023-03-18 DIAGNOSIS — E0590 Thyrotoxicosis, unspecified without thyrotoxic crisis or storm: Secondary | ICD-10-CM

## 2023-03-18 DIAGNOSIS — E079 Disorder of thyroid, unspecified: Secondary | ICD-10-CM | POA: Diagnosis not present

## 2023-03-18 DIAGNOSIS — R946 Abnormal results of thyroid function studies: Secondary | ICD-10-CM | POA: Diagnosis not present

## 2023-03-21 ENCOUNTER — Other Ambulatory Visit: Payer: Self-pay | Admitting: Family Medicine

## 2023-03-21 DIAGNOSIS — F331 Major depressive disorder, recurrent, moderate: Secondary | ICD-10-CM

## 2023-03-23 NOTE — Progress Notes (Unsigned)
HPI: Jasmine Buchanan is a 27 y.o. female, who is here today for chronic disease management.  Last seen on 12/23/22. No new problem since her last visit. She reports increased fatigue, even after a long night's sleep. She also reports experiencing heart palpitations,hat flashes, hair shedding , and brittle finger nails.  Additionally, she reports feeling constantly hungry and has lost 10 pounds since her last visit. No known hx of OSA.  She is inquiring about vitamin prescription.  Lab Results  Component Value Date   TSH <0.01 (L) 03/12/2023   Thyroid US on 03/18/23: Heterogeneous thyroid compatible with medical thyroid disease. Negative for nodule Pending appt with endocrinologist, has not received information.  Ref Range & Units 2 wk ago  Thyrotropin Receptor Ab 0.00 - 1.75 IU/L 13.70 High    She has 2  young children and struggling with childcare due to her fatigue. Her mother will be coming to help her, and she requests a letter for her mother's employer.  Anxiety and depression: She has been taking sertraline 50 mg daily. She reports some improvement with the increased dose.  She has tolerated medication well.     [id number]    2:43 PM 12/23/2022    4:33 PM 12/23/2021    8:54 AM 10/01/2021    8:41 AM  Depression screen PHQ 2/9  Decreased Interest 1 1 0 0  Down, Depressed, Hopeless  1 0 0  PHQ - 2 Score 1 2 0 0  Altered sleeping 2 0 0 0  Tired, decreased energy 3 2 0 2  Change in appetite 3 2 0 0  Feeling bad or failure about yourself  0 0 0 0  Trouble concentrating 0 0 0 0  Moving slowly or fidgety/restless 0 0 0 0  Suicidal thoughts 0 0 0 0  PHQ-9 Score 9 6 0 2  Difficult doing work/chores Extremely dIfficult Very difficult        12/23/2022    4:34 PM 12/23/2021    8:54 AM 10/01/2021    8:41 AM  GAD 7 : Generalized Anxiety Score  Nervous, Anxious, on Edge 0 0 0  Control/stop worrying 0 0 0  Worry too much - different things 0 0 0  Trouble relaxing 3 0 0  Restless 2 0 0   Easily annoyed or irritable 3 0 0  Afraid - awful might happen 0 0 0  Total GAD 7 Score 8 0 0  Anxiety Difficulty Very difficult     Review of Systems  Constitutional:  Positive for appetite change. Negative for chills and fever.  HENT:  Negative for mouth sores, sore throat and trouble swallowing.   Eyes:  Negative for pain and redness.  Respiratory:  Negative for cough, shortness of breath and wheezing.   Cardiovascular:  Negative for chest pain and leg swelling.  Gastrointestinal:  Negative for abdominal pain, nausea and vomiting.  Endocrine: Positive for heat intolerance. Negative for cold intolerance.  Genitourinary:  Negative for decreased urine volume, dysuria and hematuria.  Musculoskeletal:  Negative for gait problem and myalgias.  Skin:  Negative for rash.  Neurological:  Negative for tremors, syncope, facial asymmetry and weakness.  Psychiatric/Behavioral:  Negative for hallucinations.   See other pertinent positives and negatives in HPI.  Current Outpatient Medications on File Prior to Visit  Medication Sig Dispense Refill   albuterol (VENTOLIN HFA) 108 (90 Base) MCG/ACT inhaler Inhale 1-2 puffs into the lungs every 6 (six) hours as needed. 8 g 0   No  current facility-administered medications on file prior to visit.   Past Medical History:  Diagnosis Date   Depression    Medical history non-contributory    No Known Allergies  Social History   Socioeconomic History   Marital status: Single    Spouse name: Not on file   Number of children: Not on file   Years of education: Not on file   Highest education level: Not on file  Occupational History   Not on file  Tobacco Use   Smoking status: Former    Types: Cigarettes    Quit date: 07/23/2021    Years since quitting: 1.6   Smokeless tobacco: Never  Vaping Use   Vaping Use: Never used  Substance and Sexual Activity   Alcohol use: Not Currently   Drug use: Never   Sexual activity: Yes    Partners: Male   Other Topics Concern   Not on file  Social History Narrative   Not on file   Social Determinants of Health   Financial Resource Strain: Not on file  Food Insecurity: Not on file  Transportation Needs: Not on file  Physical Activity: Not on file  Stress: Not on file  Social Connections: Not on file   Vitals:   03/24/23 1513  BP: 100/70  Pulse: 82  Resp: 12  SpO2: 97%   Wt Readings from Last 3 Encounters:  03/24/23 151 lb 6 oz (68.7 kg)  12/23/22 161 lb 6 oz (73.2 kg)  11/11/22 161 lb 2 oz (73.1 kg)   Body mass index is 26.81 kg/m.  Physical Exam Vitals and nursing note reviewed.  Constitutional:      General: She is not in acute distress.    Appearance: She is well-developed.  HENT:     Head: Normocephalic and atraumatic.     Mouth/Throat:     Mouth: Mucous membranes are moist.     Pharynx: Oropharynx is clear.  Eyes:     Conjunctiva/sclera: Conjunctivae normal.  Neck:     Thyroid: Thyromegaly (mild) present.  Cardiovascular:     Rate and Rhythm: Normal rate and regular rhythm.     Pulses:          Dorsalis pedis pulses are 2+ on the right side and 2+ on the left side.     Heart sounds: No murmur heard. Pulmonary:     Effort: Pulmonary effort is normal. No respiratory distress.     Breath sounds: Normal breath sounds.  Abdominal:     Palpations: Abdomen is soft. There is no hepatomegaly or mass.     Tenderness: There is no abdominal tenderness.  Lymphadenopathy:     Cervical: No cervical adenopathy.  Skin:    General: Skin is warm.     Findings: No erythema or rash.  Neurological:     General: No focal deficit present.     Mental Status: She is alert and oriented to person, place, and time.     Cranial Nerves: No cranial nerve deficit.     Gait: Gait normal.  Psychiatric:        Mood and Affect: Mood is anxious.        Thought Content: Thought content does not include homicidal or suicidal ideation. Thought content does not include homicidal or  suicidal plan.     Comments: Well groomed, good eye contact.   ASSESSMENT AND PLAN:  Darcel was seen today for medical management of chronic issues.  Diagnoses and all orders for this visit:  Hyperthyroidism Assessment & Plan: Reviewed labs and thyroid US results. Discussed Dx,prognosis,and treatment options. She is symptomatic. Instructed to monitor HR regularly. She has been referred to endocrinologist, contact information given, so she can call and inquire about appt status.   Moderate episode of recurrent major depressive disorder (HCC) Assessment & Plan: Improved but still symptomatic. Some of reported symptoms could be more related to her thyroid disease. We discussed options, she is not interested in adding new meds like Abilify. She would like to increase Sertraline from 50 mg to 75 mg, 1.5 tab and can increase to 100 mg in 4-6 weeks if needed. Instructed about warning signs. F/U in 3-4 months, before if needed.  Orders: -     Sertraline HCl; Take 1.5 tablets (75 mg total) by mouth daily.  Dispense: 90 tablet; Refill: 0  Anxiety disorder, unspecified type Assessment & Plan: It has improved. Recommend CBT. Sertraline dose increased from 50 mg to 75 mg and can titrate to 100 mg if needed.   Return in about 4 months (around 07/25/2023) for chronic problems.  Amazin Pincock G. [place], MD  [facility]. [place] office.

## 2023-03-24 ENCOUNTER — Encounter: Payer: Self-pay | Admitting: Family Medicine

## 2023-03-24 ENCOUNTER — Ambulatory Visit: Payer: Medicaid Other | Admitting: Family Medicine

## 2023-03-24 ENCOUNTER — Ambulatory Visit (INDEPENDENT_AMBULATORY_CARE_PROVIDER_SITE_OTHER): Payer: Medicaid Other | Admitting: Family Medicine

## 2023-03-24 VITALS — BP 100/70 | HR 82 | Resp 12 | Ht 63.0 in | Wt 151.4 lb

## 2023-03-24 DIAGNOSIS — F331 Major depressive disorder, recurrent, moderate: Secondary | ICD-10-CM

## 2023-03-24 DIAGNOSIS — F419 Anxiety disorder, unspecified: Secondary | ICD-10-CM | POA: Diagnosis not present

## 2023-03-24 DIAGNOSIS — E0590 Thyrotoxicosis, unspecified without thyrotoxic crisis or storm: Secondary | ICD-10-CM | POA: Diagnosis not present

## 2023-03-24 MED ORDER — SERTRALINE HCL 50 MG PO TABS: 75.0000 mg | ORAL_TABLET | Freq: Every day | ORAL | 0 refills | Status: DC

## 2023-03-24 NOTE — Patient Instructions (Addendum)
A few things to remember from today's visit:  Hyperthyroidism  Moderate episode of recurrent major depressive disorder (HCC) - Plan: sertraline (ZOLOFT) 50 MG tablet [place] endocrinologist's office [phone].  Sertraline can be increased to 75 mg (1.5 tab) and in 4 weeks you can try 100 mg daily.  If you need refills for medications you take chronically, please call your pharmacy. Do not use My Chart to request refills or for acute issues that need immediate attention. If you send a my chart message, it may take a few days to be addressed, specially if I am not in the office.  Please be sure medication list is accurate. If a new problem present, please set up appointment sooner than planned today.

## 2023-03-26 NOTE — Assessment & Plan Note (Addendum)
Improved but still symptomatic. Some of reported symptoms could be more related to her thyroid disease. We discussed options, she is not interested in adding new meds like Abilify. She would like to increase Sertraline from 50 mg to 75 mg, 1.5 tab and can increase to 100 mg in 4-6 weeks if needed. Instructed about warning signs. F/U in 3-4 months, before if needed.

## 2023-03-26 NOTE — Assessment & Plan Note (Signed)
Reviewed labs and thyroid US results. Discussed Dx,prognosis,and treatment options. She is symptomatic. Instructed to monitor HR regularly. She has been referred to endocrinologist, contact information given, so she can call and inquire about appt status.

## 2023-03-26 NOTE — Assessment & Plan Note (Signed)
It has improved. Recommend CBT. Sertraline dose increased from 50 mg to 75 mg and can titrate to 100 mg if needed.

## 2023-04-03 ENCOUNTER — Telehealth: Payer: Self-pay | Admitting: Family Medicine

## 2023-04-03 DIAGNOSIS — E0590 Thyrotoxicosis, unspecified without thyrotoxic crisis or storm: Secondary | ICD-10-CM

## 2023-04-03 NOTE — Telephone Encounter (Signed)
Endo Referral

## 2023-04-06 NOTE — Telephone Encounter (Signed)
Referral was made 02/2023. Can you check on this? Thanks!

## 2023-04-06 NOTE — Telephone Encounter (Signed)
Pt called to request an Endo referral.

## 2023-04-09 ENCOUNTER — Telehealth: Payer: Self-pay

## 2023-04-09 NOTE — Telephone Encounter (Signed)
Pt called to say the Endo she was referred to cannot see her until August 2024.  Pt is requesting another referral to see a different Endocrinologist   Preferably:   [facility] [address] [place], [place] [phone]

## 2023-04-09 NOTE — Telephone Encounter (Signed)
Called patient to schedule new patient appointment. Left voicemail with our contact information to call back and schedule.  

## 2023-04-10 ENCOUNTER — Telehealth: Payer: Self-pay | Admitting: Family Medicine

## 2023-04-10 NOTE — Telephone Encounter (Signed)
Pt states she called [facility] and was told they have not received her referral.  Please advise.

## 2023-04-10 NOTE — Telephone Encounter (Signed)
Referral placed.

## 2023-04-10 NOTE — Telephone Encounter (Signed)
Error. Please disregard

## 2023-04-13 NOTE — Progress Notes (Deleted)
HPI: Jasmine Buchanan is a 27 y.o. female, who is here today for her routine physical.  Last CPE: unsure  Regular exercise 3 or more time per week: *** Following a healthy diet: ***  Chronic medical problems: ***  Immunization History  Administered Date(s) Administered   Tdap 02/04/2021   Health Maintenance  Topic Date Due   HPV VACCINES (1 - 3-dose series) Never done   PAP-Cervical Cytology Screening  Never done   PAP SMEAR-Modifier  Never done   COVID-19 Vaccine (1 - 2023-24 season) 04/29/2023 (Originally 05/23/2022)   INFLUENZA VACCINE  04/23/2023   DTaP/Tdap/Td (2 - Td or Tdap) 02/05/2031   Hepatitis C Screening  Completed   HIV Screening  Completed    She has *** concerns today.  Review of Systems  Current Outpatient Medications on File Prior to Visit  Medication Sig Dispense Refill   albuterol (VENTOLIN HFA) 108 (90 Base) MCG/ACT inhaler Inhale 1-2 puffs into the lungs every 6 (six) hours as needed. 8 g 0   sertraline (ZOLOFT) 50 MG tablet Take 1.5 tablets (75 mg total) by mouth daily. 90 tablet 0   No current facility-administered medications on file prior to visit.    Past Medical History:  Diagnosis Date   Depression    Medical history non-contributory     Past Surgical History:  Procedure Laterality Date   NO PAST SURGERIES      No Known Allergies  Family History  Problem Relation Age of Onset   Kidney disease Father    Breast cancer Paternal Grandmother     Social History   Socioeconomic History   Marital status: Single    Spouse name: Not on file   Number of children: Not on file   Years of education: Not on file   Highest education level: Not on file  Occupational History   Not on file  Tobacco Use   Smoking status: Former    Current packs/day: 0.00    Types: Cigarettes    Quit date: 07/23/2021    Years since quitting: 1.7   Smokeless tobacco: Never  Vaping Use   Vaping status: Never Used  Substance and Sexual Activity    Alcohol use: Not Currently   Drug use: Never   Sexual activity: Yes    Partners: Male  Other Topics Concern   Not on file  Social History Narrative   Not on file   Social Determinants of Health   Financial Resource Strain: Not on file  Food Insecurity: Unknown (04/17/2021)   Received from [facility] visits prior to 11/22/2022.   Hunger Vital Sign    Worried About Running Out of Food in the Last Year: Patient refused    Ran Out of Food in the Last Year: Patient refused  Transportation Needs: Not on file  Physical Activity: Not on file  Stress: Not on file  Social Connections: Not on file    There were no vitals filed for this visit. There is no height or weight on file to calculate BMI.  Wt Readings from Last 3 Encounters:  03/24/23 151 lb 6 oz (68.7 kg)  12/23/22 161 lb 6 oz (73.2 kg)  11/11/22 161 lb 2 oz (73.1 kg)    Physical Exam  ASSESSMENT AND PLAN: Jasmine Buchanan was here today annual physical examination.  No orders of the defined types were placed in this encounter.   There are no diagnoses linked to this encounter.  There are no diagnoses  linked to this encounter.  No follow-ups on file.  Betty G. [place], MD  [facility]. [place] office.

## 2023-04-14 ENCOUNTER — Ambulatory Visit: Payer: Medicaid Other | Admitting: Family Medicine

## 2023-04-17 ENCOUNTER — Telehealth: Payer: Self-pay | Admitting: Family Medicine

## 2023-04-17 NOTE — Telephone Encounter (Signed)
Requesting referral [phone] be resent. Patient states they say don't have it, fax to [phone]

## 2023-04-27 ENCOUNTER — Ambulatory Visit: Payer: Medicaid Other | Admitting: Endocrinology

## 2023-04-27 DIAGNOSIS — F332 Major depressive disorder, recurrent severe without psychotic features: Secondary | ICD-10-CM | POA: Diagnosis not present

## 2023-04-27 DIAGNOSIS — E0590 Thyrotoxicosis, unspecified without thyrotoxic crisis or storm: Secondary | ICD-10-CM | POA: Diagnosis not present

## 2023-04-27 DIAGNOSIS — I498 Other specified cardiac arrhythmias: Secondary | ICD-10-CM | POA: Diagnosis not present

## 2023-04-27 DIAGNOSIS — F3289 Other specified depressive episodes: Secondary | ICD-10-CM | POA: Diagnosis not present

## 2023-04-27 DIAGNOSIS — R259 Unspecified abnormal involuntary movements: Secondary | ICD-10-CM | POA: Diagnosis not present

## 2023-04-28 DIAGNOSIS — Z046 Encounter for general psychiatric examination, requested by authority: Secondary | ICD-10-CM | POA: Diagnosis not present

## 2023-04-29 DIAGNOSIS — Z046 Encounter for general psychiatric examination, requested by authority: Secondary | ICD-10-CM | POA: Diagnosis not present

## 2023-04-30 ENCOUNTER — Telehealth: Payer: Self-pay

## 2023-04-30 NOTE — Transitions of Care (Post Inpatient/ED Visit) (Signed)
   04/30/2023  Name: Jasmine Buchanan MRN: [id number] DOB: May 10, 1996  Today's TOC FU Call Status: Today's TOC FU Call Status:: Unsuccessful Call (1st Attempt) Unsuccessful Call (1st Attempt) Date: 04/30/23  Attempted to reach the patient regarding the most recent Inpatient/ED visit.  Follow Up Plan: Additional outreach attempts will be made to reach the patient to complete the Transitions of Care (Post Inpatient/ED visit) call.   Signature  tb,[id number]

## 2023-05-01 ENCOUNTER — Telehealth: Payer: Self-pay

## 2023-05-01 ENCOUNTER — Ambulatory Visit: Payer: Medicaid Other | Admitting: Endocrinology

## 2023-05-01 NOTE — Transitions of Care (Post Inpatient/ED Visit) (Signed)
   05/01/2023  Name: Jasmine Buchanan MRN: [id number] DOB: 1996/01/20  Today's TOC FU Call Status: Today's TOC FU Call Status:: Unsuccessful Call (2nd Attempt) Unsuccessful Call (2nd Attempt) Date: 05/01/23  Attempted to reach the patient regarding the most recent Inpatient/ED visit.  Follow Up Plan: Additional outreach attempts will be made to reach the patient to complete the Transitions of Care (Post Inpatient/ED visit) call.   Signature tb,cma

## 2023-05-12 ENCOUNTER — Telehealth (INDEPENDENT_AMBULATORY_CARE_PROVIDER_SITE_OTHER): Payer: Medicaid Other | Admitting: Family Medicine

## 2023-05-12 ENCOUNTER — Encounter: Payer: Self-pay | Admitting: Family Medicine

## 2023-05-12 VITALS — Ht 63.0 in

## 2023-05-12 DIAGNOSIS — F332 Major depressive disorder, recurrent severe without psychotic features: Secondary | ICD-10-CM

## 2023-05-12 DIAGNOSIS — E0590 Thyrotoxicosis, unspecified without thyrotoxic crisis or storm: Secondary | ICD-10-CM | POA: Diagnosis not present

## 2023-05-12 DIAGNOSIS — F419 Anxiety disorder, unspecified: Secondary | ICD-10-CM | POA: Diagnosis not present

## 2023-05-12 MED ORDER — SERTRALINE HCL 50 MG PO TABS: 75.0000 mg | ORAL_TABLET | Freq: Every day | ORAL | 1 refills | Status: DC

## 2023-05-12 MED ORDER — BUSPIRONE HCL 10 MG PO TABS: 10.0000 mg | ORAL_TABLET | Freq: Two times a day (BID) | ORAL | 0 refills | Status: DC

## 2023-05-12 NOTE — Assessment & Plan Note (Signed)
She has not taken Sertraline for a week and not on Buspar since discharge. She would like lower dose of medications, agrees with Sertraline 75 mg daily and Buspar 10 mg bid. She is planning on traveling to [place] on 06/08/23 and staying there for a few weeks, she needs to establish with psychotherapist. Clearly instructed about warning signs. She was instructed to let me know in 6 weeks if medications are helping, before if symptoms get worse.

## 2023-05-12 NOTE — Assessment & Plan Note (Signed)
Currently on Methimazole 5 mg daily, started during hospitalization. Missed appt with endocrinologist due to hospitalization and next one in 10/2023. TSH and free T4 in 2-3 weeks.

## 2023-05-12 NOTE — Assessment & Plan Note (Signed)
She will resume Sertraline and Buspar but at lower dose, 75 mg and 10 mg bid respectively. Recommend starting CBT. F/U in 3 months.

## 2023-05-12 NOTE — Progress Notes (Signed)
Virtual Visit via Video Note I connected with Jasmine Buchanan on 05/12/23 by a video enabled telemedicine application and verified that I am speaking with the correct person using two identifiers. Location patient: home Location provider:work office Persons participating in the virtual visit: patient, provider  I discussed the limitations of evaluation and management by telemedicine and the availability of in person appointments. The patient expressed understanding and agreed to proceed.  Chief Complaint  Patient presents with   Medication Management    Discuss Sertraline   HPI: Jasmine Buchanan is a 27 yo female with history of hypothyroidism, depression, and anxiety being seen today to follow on recent hospitalization.  She was last seen on 03/28/2023. She was admitted at [facility] from 04/27/23 to 04/29/23 for severe depression. She was discharged on Sertraline 100 mg daily, she was supposed to be on Buspar 10 mg tid, which she states that it was not given at the time of discharge.  She stopped Sertraline a week ago due to side effects, palpitations and since then she has had nervousness, dizziness, nausea, and episodic confusion. Reports negative pregnancy test.  Negative for fever,chills,or hallucinations. No abdominal pain,vomiting, changes in bowel habits,or urinary symptoms.  She is hesitant to resume medications, does not want to rely on medication. According to pt, it was recommended to start therapy, not psychiatrist, during facility stay Mother stayed with her for 2 weeks after discharge, left yesterday. She plans to go to [place] for support, she has family there and nobody here. Sleeps average 5-6 hours per night Denies any suicidal or homicidal ideations.  Hyperthyroidism: Recently started on methimazole 5 mg daily. Missed appt with endocrinologist due to recent hospitalization and next available appt 10/2023. According to pt,  she was advised to follow up in 4 weeks  States that she is experiencing brittle nails, inquires about taking biotin with medications Negative for CP,SOB,PND,orthopnea, or edema.  ROS: See pertinent positives and negatives per HPI.  Past Medical History:  Diagnosis Date   Depression    Medical history non-contributory     Past Surgical History:  Procedure Laterality Date   NO PAST SURGERIES      Family History  Problem Relation Age of Onset   Kidney disease Father    Breast cancer Paternal Grandmother     Social History   Socioeconomic History   Marital status: Single    Spouse name: Not on file   Number of children: Not on file   Years of education: Not on file   Highest education level: Not on file  Occupational History   Not on file  Tobacco Use   Smoking status: Former    Current packs/day: 0.00    Types: Cigarettes    Quit date: 07/23/2021    Years since quitting: 1.8   Smokeless tobacco: Never  Vaping Use   Vaping status: Never Used  Substance and Sexual Activity   Alcohol use: Not Currently   Drug use: Never   Sexual activity: Yes    Partners: Male  Other Topics Concern   Not on file  Social History Narrative   Not on file   Social Determinants of Health   Financial Resource Strain: Not on file  Food Insecurity: Unknown (04/17/2021)   Received from [facility] visits prior to 11/22/2022., [facility] visits prior to 11/22/2022.   Hunger Vital Sign    Worried About Running Out of Food in the  Last Year: Patient refused    Ran Out of Food in the Last Year: Patient refused  Transportation Needs: Not on file  Physical Activity: Not on file  Stress: Not on file  Social Connections: Not on file  Intimate Partner Violence: Not At Risk (04/17/2021)   [facility] from [facility] visits prior to 11/22/2022., [facility] visits prior to 11/22/2022.   Humiliation, Afraid, Rape, and  Kick questionnaire    Fear of Current or Ex-Partner: No    Emotionally Abused: No    Physically Abused: No    Sexually Abused: No    Current Outpatient Medications:    albuterol (VENTOLIN HFA) 108 (90 Base) MCG/ACT inhaler, Inhale 1-2 puffs into the lungs every 6 (six) hours as needed., Disp: 8 g, Rfl: 0   methimazole (TAPAZOLE) 5 MG tablet, Take 1 tablet by mouth daily., Disp: , Rfl:    busPIRone (BUSPAR) 10 MG tablet, Take 1 tablet (10 mg total) by mouth 2 (two) times daily., Disp: 180 tablet, Rfl: 0   sertraline (ZOLOFT) 50 MG tablet, Take 1.5 tablets (75 mg total) by mouth daily., Disp: 135 tablet, Rfl: 1  EXAM:  VITALS per patient if applicable:Ht 5\' 3"  (1.6 m)   BMI 26.81 kg/m    GENERAL: alert, oriented, appears well and in no acute distress  HEENT: atraumatic, conjunctiva clear, no obvious abnormalities on inspection.  NECK: normal movements of the head and neck  LUNGS: on inspection no signs of respiratory distress, breathing rate appears normal, no obvious gross SOB, gasping or wheezing  CV: no obvious cyanosis  MS: moves all visible extremities without noticeable abnormality  PSYCH/NEURO: pleasant and cooperative, no obvious depression or anxiety, speech and thought processing grossly intact  ASSESSMENT AND PLAN:  Discussed the following assessment and plan:  Hyperthyroidism Assessment & Plan: Currently on Methimazole 5 mg daily, started during hospitalization. Missed appt with endocrinologist due to hospitalization and next one in 10/2023. TSH and free T4 in 2-3 weeks.  Orders: -     T4, free; Future -     TSH; Future  Anxiety disorder, unspecified type Assessment & Plan: She will resume Sertraline and Buspar but at lower dose, 75 mg and 10 mg bid respectively. Recommend starting CBT. F/U in 3 months.   Severe episode of recurrent major depressive disorder, without psychotic features (HCC) Assessment & Plan: She has not taken Sertraline for a week  and not on Buspar since discharge. She would like lower dose of medications, agrees with Sertraline 75 mg daily and Buspar 10 mg bid. She is planning on traveling to [place] on 06/08/23 and staying there for a few weeks, she needs to establish with psychotherapist. Clearly instructed about warning signs. She was instructed to let me know in 6 weeks if medications are helping, before if symptoms get worse.   Other orders -     busPIRone HCl; Take 1 tablet (10 mg total) by mouth 2 (two) times daily.  Dispense: 180 tablet; Refill: 0 -     Sertraline HCl; Take 1.5 tablets (75 mg total) by mouth daily.  Dispense: 135 tablet; Refill: 1   We discussed possible serious and likely etiologies, options for evaluation and workup, limitations of telemedicine visit vs in person visit, treatment, treatment risks and precautions. The patient was advised to call back or seek an in-person evaluation if the symptoms worsen or if the condition fails to improve as anticipated. I discussed the assessment and treatment plan with  the patient. The patient was provided an opportunity to ask questions and all were answered. The patient agreed with the plan and demonstrated an understanding of the instructions.  Return in about 3 months (around 08/12/2023) for chronic problems. Anessia Oakland G. Swazil[place], MD  [facility]. [place] office.

## 2023-05-17 ENCOUNTER — Other Ambulatory Visit: Payer: Self-pay | Admitting: Family Medicine

## 2023-05-18 ENCOUNTER — Other Ambulatory Visit (HOSPITAL_COMMUNITY): Payer: Self-pay

## 2023-05-18 ENCOUNTER — Other Ambulatory Visit: Payer: Self-pay

## 2023-05-18 MED ORDER — METHIMAZOLE 5 MG PO TABS
5.0000 mg | ORAL_TABLET | Freq: Every day | ORAL | 0 refills | Status: DC
  Filled 2023-05-18 – 2023-07-01 (×7): qty 30, 30d supply, fill #0

## 2023-05-18 MED ORDER — BUSPIRONE HCL 10 MG PO TABS
10.0000 mg | ORAL_TABLET | Freq: Two times a day (BID) | ORAL | 0 refills | Status: AC
  Filled 2023-05-18: qty 180, 90d supply, fill #0
  Filled 2023-05-22 – 2023-11-12 (×5): qty 60, 30d supply, fill #0

## 2023-05-22 ENCOUNTER — Other Ambulatory Visit (HOSPITAL_COMMUNITY): Payer: Self-pay

## 2023-05-28 ENCOUNTER — Other Ambulatory Visit (HOSPITAL_COMMUNITY): Payer: Self-pay

## 2023-05-28 ENCOUNTER — Encounter (HOSPITAL_COMMUNITY): Payer: Self-pay

## 2023-05-29 ENCOUNTER — Other Ambulatory Visit (HOSPITAL_COMMUNITY): Payer: Self-pay

## 2023-05-29 ENCOUNTER — Encounter: Payer: Medicaid Other | Admitting: Obstetrics and Gynecology

## 2023-06-01 ENCOUNTER — Encounter (HOSPITAL_COMMUNITY): Payer: Self-pay

## 2023-06-01 ENCOUNTER — Other Ambulatory Visit (HOSPITAL_COMMUNITY): Payer: Self-pay

## 2023-06-02 ENCOUNTER — Other Ambulatory Visit: Payer: Self-pay

## 2023-06-02 ENCOUNTER — Other Ambulatory Visit (HOSPITAL_COMMUNITY): Payer: Self-pay

## 2023-06-02 ENCOUNTER — Other Ambulatory Visit (HOSPITAL_BASED_OUTPATIENT_CLINIC_OR_DEPARTMENT_OTHER): Payer: Self-pay

## 2023-06-03 ENCOUNTER — Ambulatory Visit: Payer: Medicaid Other

## 2023-06-04 ENCOUNTER — Ambulatory Visit: Payer: Medicaid Other

## 2023-06-04 ENCOUNTER — Other Ambulatory Visit: Payer: Self-pay

## 2023-06-05 ENCOUNTER — Ambulatory Visit (INDEPENDENT_AMBULATORY_CARE_PROVIDER_SITE_OTHER): Payer: Medicaid Other

## 2023-06-05 ENCOUNTER — Other Ambulatory Visit: Payer: Medicaid Other

## 2023-06-05 DIAGNOSIS — Z111 Encounter for screening for respiratory tuberculosis: Secondary | ICD-10-CM

## 2023-06-05 DIAGNOSIS — Z23 Encounter for immunization: Secondary | ICD-10-CM

## 2023-06-09 LAB — QUANTIFERON-TB GOLD PLUS
Mitogen-NIL: 8.02 [IU]/mL
NIL: 0.02 [IU]/mL
QuantiFERON-TB Gold Plus: NEGATIVE
TB1-NIL: <0 [IU]/mL
TB2-NIL: <0 [IU]/mL

## 2023-06-10 ENCOUNTER — Other Ambulatory Visit: Payer: Medicaid Other

## 2023-06-16 ENCOUNTER — Other Ambulatory Visit (HOSPITAL_BASED_OUTPATIENT_CLINIC_OR_DEPARTMENT_OTHER): Payer: Self-pay

## 2023-06-17 ENCOUNTER — Other Ambulatory Visit: Payer: Medicaid Other

## 2023-06-17 ENCOUNTER — Ambulatory Visit: Payer: Medicaid Other | Admitting: Gastroenterology

## 2023-06-20 ENCOUNTER — Other Ambulatory Visit (HOSPITAL_COMMUNITY): Payer: Self-pay

## 2023-06-24 ENCOUNTER — Other Ambulatory Visit: Payer: Medicaid Other

## 2023-06-28 ENCOUNTER — Other Ambulatory Visit: Payer: Self-pay | Admitting: Family Medicine

## 2023-06-29 ENCOUNTER — Other Ambulatory Visit (INDEPENDENT_AMBULATORY_CARE_PROVIDER_SITE_OTHER): Payer: Medicaid Other

## 2023-06-29 ENCOUNTER — Other Ambulatory Visit: Payer: Medicaid Other

## 2023-06-29 DIAGNOSIS — E0590 Thyrotoxicosis, unspecified without thyrotoxic crisis or storm: Secondary | ICD-10-CM | POA: Diagnosis not present

## 2023-06-29 LAB — T4, FREE: Free T4: 0.71 ng/dL (ref 0.60–1.60)

## 2023-06-29 LAB — TSH: TSH: 0.07 u[IU]/mL — ABNORMAL LOW (ref 0.35–5.50)

## 2023-07-01 ENCOUNTER — Other Ambulatory Visit (HOSPITAL_COMMUNITY): Payer: Self-pay

## 2023-07-01 ENCOUNTER — Encounter (HOSPITAL_COMMUNITY): Payer: Self-pay

## 2023-07-01 ENCOUNTER — Other Ambulatory Visit: Payer: Self-pay

## 2023-07-02 ENCOUNTER — Other Ambulatory Visit (HOSPITAL_COMMUNITY): Payer: Self-pay

## 2023-07-03 ENCOUNTER — Other Ambulatory Visit: Payer: Self-pay

## 2023-07-03 ENCOUNTER — Other Ambulatory Visit (HOSPITAL_COMMUNITY): Payer: Self-pay

## 2023-07-03 MED ORDER — SERTRALINE HCL 50 MG PO TABS
50.0000 mg | ORAL_TABLET | Freq: Every day | ORAL | 1 refills | Status: AC
  Filled 2023-07-03 – 2023-09-29 (×5): qty 90, 90d supply, fill #0
  Filled 2023-10-26 – 2024-02-27 (×5): qty 90, 90d supply, fill #1

## 2023-07-03 MED ORDER — METHIMAZOLE 5 MG PO TABS: 5.0000 mg | ORAL_TABLET | Freq: Every day | ORAL | 0 refills | Status: DC

## 2023-07-20 ENCOUNTER — Ambulatory Visit: Payer: Medicaid Other

## 2023-07-29 ENCOUNTER — Other Ambulatory Visit: Payer: Self-pay | Admitting: Family Medicine

## 2023-07-30 ENCOUNTER — Other Ambulatory Visit: Payer: Self-pay

## 2023-07-30 ENCOUNTER — Other Ambulatory Visit (HOSPITAL_COMMUNITY): Payer: Self-pay

## 2023-07-31 ENCOUNTER — Other Ambulatory Visit: Payer: Self-pay

## 2023-07-31 ENCOUNTER — Encounter (HOSPITAL_COMMUNITY): Payer: Self-pay

## 2023-07-31 ENCOUNTER — Other Ambulatory Visit (HOSPITAL_COMMUNITY): Payer: Self-pay

## 2023-07-31 ENCOUNTER — Other Ambulatory Visit (HOSPITAL_BASED_OUTPATIENT_CLINIC_OR_DEPARTMENT_OTHER): Payer: Self-pay

## 2023-07-31 MED ORDER — METHIMAZOLE 5 MG PO TABS
5.0000 mg | ORAL_TABLET | Freq: Every day | ORAL | 5 refills | Status: DC
  Filled 2023-07-31 (×2): qty 30, 30d supply, fill #0
  Filled 2023-08-28 – 2023-08-31 (×2): qty 30, 30d supply, fill #1
  Filled 2023-09-29: qty 30, 30d supply, fill #2
  Filled 2023-10-26: qty 30, 30d supply, fill #3
  Filled 2023-11-12 – 2023-12-08 (×3): qty 30, 30d supply, fill #4
  Filled ????-??-??: fill #4

## 2023-08-29 ENCOUNTER — Other Ambulatory Visit (HOSPITAL_BASED_OUTPATIENT_CLINIC_OR_DEPARTMENT_OTHER): Payer: Self-pay

## 2023-08-29 ENCOUNTER — Other Ambulatory Visit (HOSPITAL_COMMUNITY): Payer: Self-pay

## 2023-08-31 ENCOUNTER — Other Ambulatory Visit (HOSPITAL_COMMUNITY): Payer: Self-pay

## 2023-08-31 ENCOUNTER — Other Ambulatory Visit: Payer: Self-pay

## 2023-08-31 ENCOUNTER — Other Ambulatory Visit (HOSPITAL_BASED_OUTPATIENT_CLINIC_OR_DEPARTMENT_OTHER): Payer: Self-pay

## 2023-09-01 ENCOUNTER — Other Ambulatory Visit: Payer: Self-pay

## 2023-09-29 ENCOUNTER — Other Ambulatory Visit (HOSPITAL_COMMUNITY): Payer: Self-pay

## 2023-09-29 ENCOUNTER — Other Ambulatory Visit: Payer: Self-pay

## 2023-10-26 ENCOUNTER — Other Ambulatory Visit (HOSPITAL_COMMUNITY): Payer: Self-pay

## 2023-10-27 ENCOUNTER — Other Ambulatory Visit: Payer: Self-pay

## 2023-11-06 ENCOUNTER — Encounter: Payer: Medicaid Other | Admitting: Family Medicine

## 2023-11-09 ENCOUNTER — Ambulatory Visit: Payer: Self-pay | Admitting: Family Medicine

## 2023-11-09 ENCOUNTER — Encounter: Payer: Medicaid Other | Admitting: Family Medicine

## 2023-11-09 NOTE — Telephone Encounter (Signed)
Copied from CRM [id number]. Topic: Clinical - Red Word Triage >> Nov 09, 2023  1:02 PM Deaijah H wrote: Red Word that prompted transfer to Nurse Triage: Referral for physiatrist/therapist/ anxiety  worse  Chief Complaint: anxiety Symptoms: SOB w/ anxiety, trouble concentrating, trouble sleeping, nausea, Heart racing & pounding, restless at night: wake up Q2hrs feeling anxious & nervous, Overthink about things, overwhelmed, depression Frequency: x 1 week Pertinent Negatives: Patient denies having increased anxiety at present time Disposition: [] ED /[] Urgent Care (no appt availability in office) / [] Appointment(In office/virtual)/ []  [facility]/ [] Home Care/ [] Refused Recommended Disposition /[] [facility]/ [x]  Follow-up with PCP [place]: [place] could not offer video appt today. Referred pt to ED if increased anxiety while out of town. Pt stated she will be back in town tomorrow & would like referral to psych/therapy. Has CPS involved r/t to kids & referral needed for them as well.  Reason for Disposition  MILD anxiety symptoms (e.g., Anxiety symptoms are mild and intermittent; symptoms do not interfere with daily activities)  Answer Assessment - Initial Assessment Questions 1. CONCERN: "Did anything happen that prompted you to call today?"      Pt stated c/o because she realized anxiety has being increasing: it comes and goes.  She has CPS involved due to her kids: pt & CPS would like to get things in place ex/referrals to help pt 2. ANXIETY SYMPTOMS: "Can you describe how you (your loved one; patient) have been feeling?" (e.g., tense, restless, panicky, anxious, keyed up, overwhelmed, sense of impending doom).      Heart racing & pounding, restless at night: wake up Q2hrs feeling anxious & nervous, Overthink about things, overwhelmed, depression 3. ONSET: "How long have you been feeling this way?" (e.g., hours, days, weeks)     X 1  week 4. SEVERITY: "How would you rate the level of anxiety?" (e.g., 0 - 10; or mild, moderate, severe).     severe 5. FUNCTIONAL IMPAIRMENT: "How have these feelings affected your ability to do daily activities?" "Have you had more difficulty than usual doing your normal daily activities?" (e.g., getting better, same, worse; self-care, school, work, interactions)     Affects thinking process, unable to complete sentences when high 6. HISTORY: "Have you felt this way before?" "Have you ever been diagnosed with an anxiety problem in the past?" (e.g., generalized anxiety disorder, panic attacks, PTSD). If Yes, ask: "How was this problem treated?" (e.g., medicines, counseling, etc.)     Yes-but was under control with medication 7. RISK OF HARM - SUICIDAL IDEATION: "Do you ever have thoughts of hurting or killing yourself?" If Yes, ask:  "Do you have these feelings now?" "Do you have a plan on how you would do this?"     no 8. TREATMENT:  "What has been done so far to treat this anxiety?" (e.g., medicines, relaxation strategies). "What has helped?"     Medication daily 9. TREATMENT - THERAPIST: "Do you have a counselor or therapist? Name?"     No but would like one 10. POTENTIAL TRIGGERS: "Do you drink caffeinated beverages (e.g., coffee, colas, teas), and how much daily?" "Do you drink alcohol or use any drugs?" "Have you started any new medicines recently?"       Take medications: has not skipped doses 11. PATIENT SUPPORT: "Who is with you now?" "Who do you live with?" "Do you have family or friends who you can talk to?"  sometimes 12. OTHER SYMPTOMS: "Do you have any other symptoms?" (e.g., feeling depressed, trouble concentrating, trouble sleeping, trouble breathing, palpitations or fast heartbeat, chest pain, sweating, nausea, or diarrhea)       SOB w/ anxiety, trouble concentrating, trouble sleeping, nausea 13. PREGNANCY: "Is there any chance you are pregnant?" "When was your last menstrual  period?"       N/a  Protocols used: Anxiety and Panic Attack-A-AH

## 2023-11-09 NOTE — Telephone Encounter (Signed)
Has appointment with you tomorrow to discuss these referrals, she'll be back in town at that time.

## 2023-11-09 NOTE — Telephone Encounter (Signed)
 Aware. BJ

## 2023-11-10 ENCOUNTER — Encounter: Payer: Self-pay | Admitting: Family Medicine

## 2023-11-10 ENCOUNTER — Telehealth (INDEPENDENT_AMBULATORY_CARE_PROVIDER_SITE_OTHER): Payer: Medicaid Other | Admitting: Family Medicine

## 2023-11-10 VITALS — HR 72 | Ht 63.0 in

## 2023-11-10 DIAGNOSIS — E0590 Thyrotoxicosis, unspecified without thyrotoxic crisis or storm: Secondary | ICD-10-CM | POA: Diagnosis not present

## 2023-11-10 DIAGNOSIS — F332 Major depressive disorder, recurrent severe without psychotic features: Secondary | ICD-10-CM

## 2023-11-10 DIAGNOSIS — F419 Anxiety disorder, unspecified: Secondary | ICD-10-CM

## 2023-11-10 NOTE — Assessment & Plan Note (Signed)
Problem has not been adequately controlled, last TSH in 06/2023 was 0.07. Currently on Tapazole 5 mg daily. Appointment with endocrinologist in 03/2024. Will come for blood work later this week or the beginning of next one.

## 2023-11-10 NOTE — Progress Notes (Incomplete)
Virtual Visit via Video Note I connected with Jasmine Buchanan on 11/10/2023 by a video enabled telemedicine application and verified that I am speaking with the correct person using two identifiers. Location patient: home Location provider:work office Persons participating in the virtual visit: patient, provider, scribe  I discussed the limitations of evaluation and management by telemedicine and the availability of in person appointments. The patient expressed understanding and agreed to proceed.  Chief Complaint  Patient presents with   Referral   HPI: [name] is a 28 y.o. female with a PMHx significant for hyperthyroidism, recurrent major depression, and anxiety, who is being seen on video today for worsening anxiety. She was last seen on 05/12/23.  Anxiety/depression:  Patient complains of increased anxiety over the last week.  She says she wakes up in the morning feeling nervous and her heart is pounding. Also waking up at night every 2-3 hours and having difficulty falling back to sleep.  She mentions she recently separated from her ex, and he took their 2 of her children with him.  She is taking Zoloft 50 mg daily. Not taking Buspar 10 mg twice daily.  She would like referrals to psychiatry and psychotherapy.  States that child protective service is involved and she needs proof that she is pursuing further treatment to help with depression and anxiety.  Hyperthyroidism:  Currently on Tapazole 5 mg daily.  + heat intolerance. She believes she is still losing weight. Also having palpitations, tremors, and episodes of diarrhea. She says she has had significant difficulty getting an appointment with endocrinology, missed one while she was hospitalized. She says she has an appointment scheduled for 04/08/2024.   Lab Results  Component Value Date   TSH 0.07 (L) 06/29/2023   Negative for fever,chills, sore throat, CP, SOB,abdominal pain, N/V, urinary symptoms, or skin  rash.  She needs a letter for school stating that she has hx of anxiety and hyperthyroidism and currently under treatment.  ROS: See pertinent positives and negatives per HPI.  Past Medical History:  Diagnosis Date   Depression    Medical history non-contributory    Past Surgical History:  Procedure Laterality Date   NO PAST SURGERIES     Family History  Problem Relation Age of Onset   Kidney disease Father    Breast cancer Paternal Grandmother    Social History   Socioeconomic History   Marital status: Single    Spouse name: Not on file   Number of children: Not on file   Years of education: Not on file   Highest education level: Not on file  Occupational History   Not on file  Tobacco Use   Smoking status: Former    Current packs/day: 0.00    Types: Cigarettes    Quit date: 07/23/2021    Years since quitting: 2.3   Smokeless tobacco: Never  Vaping Use   Vaping status: Never Used  Substance and Sexual Activity   Alcohol use: Not Currently   Drug use: Never   Sexual activity: Yes    Partners: Male  Other Topics Concern   Not on file  Social History Narrative   Not on file   Social Drivers of Health   Financial Resource Strain: Not on file  Food Insecurity: Unknown (04/17/2021)   [facility] prior to 11/22/2022., [facility] visits prior to 11/22/2022.   Hunger Vital Sign    Worried About Programme researcher, broadcasting/film/video in  the Last Year: Patient refused    Ran Out of Food in the Last Year: Patient refused  Transportation Needs: Not on file  Physical Activity: Not on file  Stress: Not on file  Social Connections: Not on file  Intimate Partner Violence: Not At Risk (04/17/2021)   [facility] visits prior to 11/22/2022., [facility] visits prior to 11/22/2022.   Humiliation, Afraid, Rape, and Kick questionnaire    Fear of Current or Ex-Partner: No     Emotionally Abused: No    Physically Abused: No    Sexually Abused: No    Current Outpatient Medications:    albuterol (VENTOLIN HFA) 108 (90 Base) MCG/ACT inhaler, Inhale 1-2 puffs into the lungs every 6 (six) hours as needed., Disp: 8 g, Rfl: 0   busPIRone (BUSPAR) 10 MG tablet, Take 1 tablet (10 mg total) by mouth 2 (two) times daily., Disp: 180 tablet, Rfl: 0   methimazole (TAPAZOLE) 5 MG tablet, Take 1 tablet (5 mg total) by mouth daily., Disp: 30 tablet, Rfl: 5   sertraline (ZOLOFT) 50 MG tablet, TAKE 1 TABLET(50 MG) BY MOUTH DAILY, Disp: 90 tablet, Rfl: 1   sertraline (ZOLOFT) 50 MG tablet, Take 1 tablet (50 mg total) by mouth daily., Disp: 90 tablet, Rfl: 1  EXAM:  VITALS per patient if applicable:Pulse 72   Ht 5\' 3"  (1.6 m)   BMI 26.81 kg/m    GENERAL: alert, oriented, appears well and in no acute distress  HEENT: atraumatic, conjunctiva clear, no obvious abnormalities on inspection of external nose and ears  NECK: normal movements of the head and neck  LUNGS: on inspection no signs of respiratory distress, breathing rate appears normal, no obvious gross SOB, gasping or wheezing  CV: no obvious cyanosis  MS: moves all visible extremities without noticeable abnormality  PSYCH/NEURO: pleasant and cooperative, no obvious depression or anxiety, flat affect. Speech and thought processing grossly intact  ASSESSMENT AND PLAN:  Discussed the following assessment and plan:  Hyperthyroidism Assessment & Plan: Problem has not been adequately controlled, last TSH in 06/2023 was 0.07. Currently on Tapazole 5 mg daily. Appointment with endocrinologist in 03/2024. Will come for blood work later this week or the beginning of next one.  Orders: -     TSH; Future  Severe episode of recurrent major depressive disorder, without psychotic features (HCC) Assessment & Plan: Problem is not well-controlled. She has not started BuSpar I recommended during her last visit, she still  has medication at home and agrees with starting 10 mg twice daily. Continue sertraline 50 mg daily. She will call [facility] to arrange appointment with psychotherapist and psychiatrist. Psychiatry referral placed. Follow-up in 4 to 6 weeks, before if needed.  Orders: -     Ambulatory referral to Psychiatry  Anxiety disorder, unspecified type Assessment & Plan: Problem is not well-controlled, aggravated by recent issues with family. Start BuSpar 10 mg twice daily. Continue sertraline 50 mg daily. She will arrange appointment for CBT.  Orders: -     Ambulatory referral to Psychiatry   We discussed possible serious and likely etiologies, options for evaluation and workup, limitations of telemedicine visit vs in person visit, treatment, treatment risks and precautions. The patient was advised to call back or seek an in-person evaluation if the symptoms worsen or if the condition fails to improve as anticipated. I discussed the assessment and treatment plan with the patient. The patient was provided an opportunity to  ask questions and all were answered. The patient agreed with the plan and demonstrated an understanding of the instructions.  Return in about 6 weeks (around 12/22/2023).  I, Rolla Etienne Wierda, acting as a scribe for Jaquin Coy [place], MD., have documented all relevant documentation on the behalf of Jasmine Thornsberry [place], MD, as directed by  Jasmine Whitehead [place], MD while in the presence of Jasmine Brouillet [place], MD.   I, Jasmine Bucknam [place], MD, have reviewed all documentation for this visit. The documentation on 11/11/23 for the exam, diagnosis, procedures, and orders are all accurate and complete.  Jasmine Peden [place], MD

## 2023-11-10 NOTE — Assessment & Plan Note (Signed)
Problem is not well-controlled, aggravated by recent issues with family. Start BuSpar 10 mg twice daily. Continue sertraline 50 mg daily. She will arrange appointment for CBT.

## 2023-11-10 NOTE — Assessment & Plan Note (Signed)
Problem is not well-controlled. She has not started BuSpar I recommended during her last visit, she still has medication at home and agrees with starting 10 mg twice daily. Continue sertraline 50 mg daily. She will call [facility] to arrange appointment with psychotherapist and psychiatrist. Psychiatry referral placed. Follow-up in 4 to 6 weeks, before if needed.

## 2023-11-11 ENCOUNTER — Encounter: Payer: Self-pay | Admitting: Family Medicine

## 2023-11-11 ENCOUNTER — Telehealth: Payer: Self-pay | Admitting: Family Medicine

## 2023-11-11 NOTE — Telephone Encounter (Signed)
Called and spoke with patient, patient has the number for the referral that Dr. Swaziland put in.

## 2023-11-11 NOTE — Telephone Encounter (Signed)
Copied from CRM [id number]. Topic: Referral - Question >> Nov 10, 2023  1:08 PM [place] J wrote: Reason for CRM: Patient called in because she attempted to call the number that Dr. Sw[place]iland had given her during their appointment today but when the patient called the department for psychiatry, they stated that they cannot establish her with psychiatry or for psychotherapy. The clinic was only able to set her up with therapy. Patient would like a call back at the soonest convenience due to urgent situation.

## 2023-11-11 NOTE — Addendum Note (Signed)
Addended by: [place], Niomie Englert G on: 11/11/2023 03:20 PM   Modules accepted: Orders

## 2023-11-12 ENCOUNTER — Other Ambulatory Visit: Payer: Self-pay

## 2023-11-12 ENCOUNTER — Other Ambulatory Visit (HOSPITAL_COMMUNITY): Payer: Self-pay

## 2023-11-12 DIAGNOSIS — F53 Postpartum depression: Secondary | ICD-10-CM | POA: Diagnosis not present

## 2023-11-13 ENCOUNTER — Other Ambulatory Visit (HOSPITAL_COMMUNITY): Payer: Self-pay

## 2023-11-13 ENCOUNTER — Other Ambulatory Visit: Payer: Self-pay

## 2023-11-17 ENCOUNTER — Telehealth: Payer: Self-pay

## 2023-11-17 ENCOUNTER — Encounter: Payer: Medicaid Other | Admitting: Family Medicine

## 2023-11-17 NOTE — Telephone Encounter (Signed)
 Copied from CRM [id number]. Topic: Clinical - Request for Lab/Test Order >> Nov 17, 2023  9:11 AM Adele Barthel wrote: Reason for CRM:   Patient called to reschedule her physical and will be rescheduling through her MyChart. She is requesting if provider could place order for basic labwork so that she can have completed along with physical.   CB# [phone], or can send message to mychart when orders are placed.

## 2023-11-17 NOTE — Telephone Encounter (Signed)
 Pt will discuss getting additional lab with dr Swaziland on 11-20-2023

## 2023-11-17 NOTE — Telephone Encounter (Signed)
 Just needs lab appt

## 2023-11-18 ENCOUNTER — Other Ambulatory Visit: Payer: Self-pay

## 2023-11-20 ENCOUNTER — Encounter: Payer: Self-pay | Admitting: Family Medicine

## 2023-11-20 ENCOUNTER — Ambulatory Visit (INDEPENDENT_AMBULATORY_CARE_PROVIDER_SITE_OTHER): Payer: Medicaid Other | Admitting: Family Medicine

## 2023-11-20 ENCOUNTER — Other Ambulatory Visit (HOSPITAL_COMMUNITY)
Admission: RE | Admit: 2023-11-20 | Discharge: 2023-11-20 | Disposition: A | Discharge status: Home / Self Care | Source: Ambulatory Visit | Attending: Family Medicine | Admitting: Family Medicine

## 2023-11-20 VITALS — BP 110/70 | HR 79 | Resp 12 | Ht 63.0 in | Wt 156.2 lb

## 2023-11-20 DIAGNOSIS — E0590 Thyrotoxicosis, unspecified without thyrotoxic crisis or storm: Secondary | ICD-10-CM

## 2023-11-20 DIAGNOSIS — Z113 Encounter for screening for infections with a predominantly sexual mode of transmission: Secondary | ICD-10-CM | POA: Insufficient documentation

## 2023-11-20 DIAGNOSIS — F53 Postpartum depression: Secondary | ICD-10-CM | POA: Diagnosis not present

## 2023-11-20 DIAGNOSIS — Z0000 Encounter for general adult medical examination without abnormal findings: Secondary | ICD-10-CM | POA: Diagnosis not present

## 2023-11-20 NOTE — Assessment & Plan Note (Signed)
 Probably has not been adequately controlled. Still having some symptoms. Currently on Tapazole 5 mg daily. Further recommendation will be given according to TSH result. Appointment with endocrinologist in 03/2024.

## 2023-11-20 NOTE — Addendum Note (Signed)
 Addended by: Gertie Baron D on: 11/20/2023 03:26 PM   Modules accepted: Orders

## 2023-11-20 NOTE — Patient Instructions (Addendum)
 A few things to remember from today's visit:  Routine general medical examination at a health care facility  Hyperthyroidism - Plan: TSH  Screen for STD (sexually transmitted disease) - Plan: RPR, HIV Antibody (routine testing w rflx), Urine cytology ancillary only  If you need refills for medications you take chronically, please call your pharmacy. Do not use My Chart to request refills or for acute issues that need immediate attention. If you send a my chart message, it may take a few days to be addressed, specially if I am not in the office.  Please be sure medication list is accurate. If a new problem present, please set up appointment sooner than planned today.  Health Maintenance, Female Adopting a healthy lifestyle and getting preventive care are important in promoting health and wellness. Ask your health care provider about: The right schedule for you to have regular tests and exams. Things you can do on your own to prevent diseases and keep yourself healthy. What should I know about diet, weight, and exercise? Eat a healthy diet  Eat a diet that includes plenty of vegetables, fruits, low-fat dairy products, and lean protein. Do not eat a lot of foods that are high in solid fats, added sugars, or sodium. Maintain a healthy weight Body mass index (BMI) is used to identify weight problems. It estimates body fat based on height and weight. Your health care provider can help determine your BMI and help you achieve or maintain a healthy weight. Get regular exercise Get regular exercise. This is one of the most important things you can do for your health. Most adults should: Exercise for at least 150 minutes each week. The exercise should increase your heart rate and make you sweat (moderate-intensity exercise). Do strengthening exercises at least twice a week. This is in addition to the moderate-intensity exercise. Spend less time sitting. Even light physical activity can be  beneficial. Watch cholesterol and blood lipids Have your blood tested for lipids and cholesterol at 26 years of age, then have this test every 5 years. Have your cholesterol levels checked more often if: Your lipid or cholesterol levels are high. You are older than 26 years of age. You are at high risk for heart disease. What should I know about cancer screening? Depending on your health history and family history, you may need to have cancer screening at various ages. This may include screening for: Breast cancer. Cervical cancer. Colorectal cancer. Skin cancer. Lung cancer. What should I know about heart disease, diabetes, and high blood pressure? Blood pressure and heart disease High blood pressure causes heart disease and increases the risk of stroke. This is more likely to develop in people who have high blood pressure readings or are overweight. Have your blood pressure checked: Every 3-5 years if you are 28-25 years of age. Every year if you are 34 years old or older. Diabetes Have regular diabetes screenings. This checks your fasting blood sugar level. Have the screening done: Once every three years after age 91 if you are at a normal weight and have a low risk for diabetes. More often and at a younger age if you are overweight or have a high risk for diabetes. What should I know about preventing infection? Hepatitis B If you have a higher risk for hepatitis B, you should be screened for this virus. Talk with your health care provider to find out if you are at risk for hepatitis B infection. Hepatitis C Testing is recommended for: Everyone born  from 1945 through 1965. Anyone with known risk factors for hepatitis C. Sexually transmitted infections (STIs) Get screened for STIs, including gonorrhea and chlamydia, if: You are sexually active and are younger than 26 years of age. You are older than 26 years of age and your health care provider tells you that you are at risk for  this type of infection. Your sexual activity has changed since you were last screened, and you are at increased risk for chlamydia or gonorrhea. Ask your health care provider if you are at risk. Ask your health care provider about whether you are at high risk for HIV. Your health care provider may recommend a prescription medicine to help prevent HIV infection. If you choose to take medicine to prevent HIV, you should first get tested for HIV. You should then be tested every 3 months for as long as you are taking the medicine. Pregnancy If you are about to stop having your period (premenopausal) and you may become pregnant, seek counseling before you get pregnant. Take 400 to 800 micrograms (mcg) of folic acid every day if you become pregnant. Ask for birth control (contraception) if you want to prevent pregnancy. Osteoporosis and menopause Osteoporosis is a disease in which the bones lose minerals and strength with aging. This can result in bone fractures. If you are 66 years old or older, or if you are at risk for osteoporosis and fractures, ask your health care provider if you should: Be screened for bone loss. Take a calcium or vitamin D supplement to lower your risk of fractures. Be given hormone replacement therapy (HRT) to treat symptoms of menopause. Follow these instructions at home: Alcohol use Do not drink alcohol if: Your health care provider tells you not to drink. You are pregnant, may be pregnant, or are planning to become pregnant. If you drink alcohol: Limit how much you have to: 0-1 drink a day. Know how much alcohol is in your drink. In the [place] one drink equals one 12 oz bottle of beer (355 mL), one 5 oz glass of wine (148 mL), or one 1 oz glass of hard liquor (44 mL). Lifestyle Do not use any products that contain nicotine or tobacco. These products include cigarettes, chewing tobacco, and vaping devices, such as e-cigarettes. If you need help quitting, ask your health  care provider. Do not use street drugs. Do not share needles. Ask your health care provider for help if you need support or information about quitting drugs. General instructions Schedule regular health, dental, and eye exams. Stay current with your vaccines. Tell your health care provider if: You often feel depressed. You have ever been abused or do not feel safe at home. Summary Adopting a healthy lifestyle and getting preventive care are important in promoting health and wellness. Follow your health care provider's instructions about healthy diet, exercising, and getting tested or screened for diseases. Follow your health care provider's instructions on monitoring your cholesterol and blood pressure. This information is not intended to replace advice given to you by your health care provider. Make sure you discuss any questions you have with your health care provider. Document Revised: 01/28/2021 Document Reviewed: 01/28/2021 Elsevier Patient Education  2024 [employer].

## 2023-11-20 NOTE — Assessment & Plan Note (Addendum)
 We discussed the importance of regular physical activity and healthy diet for prevention of chronic illness and/or complications. Preventive guidelines reviewed. STD prevention discussed and well as birth control methods. She is not interested in OCP's, recommend to consider IUD. Vaccination: She is not sure if she had HPV vaccine in the past.  She will try to obtain copy of the vaccinations and if HPV is needed, recommend verifying coverage with her insurance. She reports Pap smear up-to-date, she will try to bring a copy of her last one.  Currently she has not established with gynecologist. Next CPE in a year.

## 2023-11-20 NOTE — Progress Notes (Signed)
 HPI: Jasmine Buchanan is a 28 y.o. female with a PMHx significant for hyperthyroidism, recurrent major depression, and anxiety, who is here today for her routine physical.  Last CPE: Over a year ago.   Exercise: Patient admits she has not been exercising lately.   Diet: She eats out about three times per week.  Sleep: She has only been getting 3-5 hours per night recently.  Alcohol Use: occasionally Smoking: never Vision: UTD on routine vision care. She wears contacts.  Dental: Not currently seeing a dentist.   Immunization History  Administered Date(s) Administered   Influenza, Seasonal, Injecte, Preservative Fre 06/05/2023   Tdap 02/04/2021   Health Maintenance  Topic Date Due   HPV VACCINES (1 - 3-dose series) Never done   Cervical Cancer Screening (Pap smear)  Never done   COVID-19 Vaccine (1 - 2024-25 season) 12/06/2023 (Originally 05/24/2023)   DTaP/Tdap/Td (2 - Td or Tdap) 02/05/2031   INFLUENZA VACCINE  Completed   Hepatitis C Screening  Completed   HIV Screening  Completed   Chronic medical problems:   Anxiety/Depression:  Currently on Buspirone 10 mg twice daily and Sertraline 100 mg daily.  She has been following with psychiatry and recently had her sertraline increased from 50 mg to 100 mg, which she says has been helpful.   Hyperthyroidism:  Currently on Methimazole 5 mg daily.  She still has occasional palpitation and tremor.  She has not seen endocrinology yet. Her appointment is in 6 weeks.   Lab Results  Component Value Date   TSH 0.07 (L) 06/29/2023   Concerns today:   She would also like to have HIV and STD testing today.  She has not had any symptoms.  Not wearing condoms or using any birth control.   Review of Systems  Constitutional:  Positive for fatigue. Negative for activity change, appetite change and fever.  HENT:  Negative for mouth sores, sore throat and trouble swallowing.   Eyes:  Negative for redness and visual disturbance.   Respiratory:  Negative for cough, shortness of breath and wheezing.   Cardiovascular:  Positive for palpitations. Negative for chest pain and leg swelling.  Gastrointestinal:  Negative for abdominal pain, nausea and vomiting.  Endocrine: Negative for cold intolerance, heat intolerance, polydipsia, polyphagia and polyuria.  Genitourinary:  Negative for decreased urine volume, dysuria, hematuria, vaginal bleeding and vaginal discharge.  Musculoskeletal:  Negative for gait problem and myalgias.  Skin:  Negative for color change and rash.  Allergic/Immunologic: Positive for environmental allergies.  Neurological:  Negative for seizures, syncope, weakness and headaches.  Hematological:  Negative for adenopathy. Does not bruise/bleed easily.  Psychiatric/Behavioral:  Positive for sleep disturbance. Negative for confusion and hallucinations. The patient is nervous/anxious.   All other systems reviewed and are negative.  Current Outpatient Medications on File Prior to Visit  Medication Sig Dispense Refill   albuterol (VENTOLIN HFA) 108 (90 Base) MCG/ACT inhaler Inhale 1-2 puffs into the lungs every 6 (six) hours as needed. 8 g 0   busPIRone (BUSPAR) 10 MG tablet Take 1 tablet (10 mg total) by mouth 2 (two) times daily. 180 tablet 0   methimazole (TAPAZOLE) 5 MG tablet Take 1 tablet (5 mg total) by mouth daily. 30 tablet 5   sertraline (ZOLOFT) 50 MG tablet TAKE 1 TABLET(50 MG) BY MOUTH DAILY 90 tablet 1   sertraline (ZOLOFT) 50 MG tablet Take 1 tablet (50 mg total) by mouth daily. 90 tablet 1   No current facility-administered medications on file prior  to visit.   Past Medical History:  Diagnosis Date   Depression    Medical history non-contributory     Past Surgical History:  Procedure Laterality Date   NO PAST SURGERIES     No Known Allergies  Family History  Problem Relation Age of Onset   Kidney disease Father    Breast cancer Paternal Grandmother    Social History    Socioeconomic History   Marital status: Single    Spouse name: Not on file   Number of children: Not on file   Years of education: Not on file   Highest education level: Not on file  Occupational History   Not on file  Tobacco Use   Smoking status: Former    Current packs/day: 0.00    Types: Cigarettes    Quit date: 07/23/2021    Years since quitting: 2.3   Smokeless tobacco: Never  Vaping Use   Vaping status: Never Used  Substance and Sexual Activity   Alcohol use: Not Currently   Drug use: Never   Sexual activity: Yes    Partners: Male  Other Topics Concern   Not on file  Social History Narrative   Not on file   Social Drivers of Health   Financial Resource Strain: Not on file  Food Insecurity: Unknown (04/17/2021)   Received from [facility] visits prior to 11/22/2022., [facility] visits prior to 11/22/2022.   Hunger Vital Sign    Worried About Running Out of Food in the Last Year: Patient refused    Ran Out of Food in the Last Year: Patient refused  Transportation Needs: Not on file  Physical Activity: Not on file  Stress: Not on file  Social Connections: Not on file   Vitals:   11/20/23 1531  BP: 110/70  Pulse: 79  Resp: 12  SpO2: 98%   Body mass index is 27.68 kg/m.  Wt Readings from Last 3 Encounters:  11/20/23 156 lb 4 oz (70.9 kg)  03/24/23 151 lb 6 oz (68.7 kg)  12/23/22 161 lb 6 oz (73.2 kg)   Physical Exam Vitals and nursing note reviewed.  Constitutional:      General: She is not in acute distress.    Appearance: She is well-developed.  HENT:     Head: Normocephalic and atraumatic.     Right Ear: Tympanic membrane, ear canal and external ear normal.     Left Ear: Tympanic membrane, ear canal and external ear normal.     Mouth/Throat:     Mouth: Mucous membranes are moist.     Pharynx: Oropharynx is clear. Uvula midline.  Eyes:     Extraocular Movements: Extraocular movements intact.      Conjunctiva/sclera: Conjunctivae normal.     Pupils: Pupils are equal, round, and reactive to light.  Neck:     Thyroid: Thyromegaly (mild) present.  Cardiovascular:     Rate and Rhythm: Normal rate and regular rhythm.     Pulses:          Dorsalis pedis pulses are 2+ on the right side and 2+ on the left side.     Heart sounds: No murmur heard. Pulmonary:     Effort: Pulmonary effort is normal. No respiratory distress.     Breath sounds: Normal breath sounds.  Abdominal:     Palpations: Abdomen is soft. There is no hepatomegaly or mass.     Tenderness: There is no abdominal tenderness.  Genitourinary:  Comments: No concerns today. Musculoskeletal:     Right lower leg: No edema.     Left lower leg: No edema.     Comments: No major deformity or signs of synovitis appreciated.  Lymphadenopathy:     Cervical: No cervical adenopathy.     Upper Body:     Right upper body: No supraclavicular adenopathy.     Left upper body: No supraclavicular adenopathy.  Skin:    General: Skin is warm.     Findings: No erythema or rash.  Neurological:     General: No focal deficit present.     Mental Status: She is alert and oriented to person, place, and time.     Cranial Nerves: No cranial nerve deficit.     Sensory: No sensory deficit.     Motor: No weakness.     Coordination: Coordination normal.     Gait: Gait normal.     Deep Tendon Reflexes:     Reflex Scores:      Bicep reflexes are 2+ on the right side and 2+ on the left side.      Patellar reflexes are 2+ on the right side and 2+ on the left side. Psychiatric:        Mood and Affect: Affect normal. Mood is anxious.   ASSESSMENT AND PLAN:  [name] was here today for her annual physical examination.  Orders Placed This Encounter  Procedures   RPR   HIV Antibody (routine testing w rflx)   Lab Results  Component Value Date   TSH 6.72 (H) 11/20/2023   Routine general medical examination at a health care  facility Assessment & Plan: We discussed the importance of regular physical activity and healthy diet for prevention of chronic illness and/or complications. Preventive guidelines reviewed. STD prevention discussed and well as birth control methods. She is not interested in OCP's, recommend to consider IUD. Vaccination: She is not sure if she had HPV vaccine in the past.  She will try to obtain copy of the vaccinations and if HPV is needed, recommend verifying coverage with her insurance. She reports Pap smear up-to-date, she will try to bring a copy of her last one.  Currently she has not established with gynecologist. Next CPE in a year.   Hyperthyroidism Assessment & Plan: Probably has not been adequately controlled. Still having some symptoms. Currently on Tapazole 5 mg daily. Further recommendation will be given according to TSH result. Appointment with endocrinologist in 03/2024.  Orders: -     TSH  Screen for STD (sexually transmitted disease) -     RPR; Future -     HIV Antibody (routine testing w rflx); Future -     Urine cytology ancillary only; Future  Return in 1 year (on 11/19/2024) for CPE.  I, Rolla Etienne Wierda, acting as a scribe for Hadlyn Amero [place], MD., have documented all relevant documentation on the behalf of Jasmine Rennert [place], MD, as directed by  Rontae Inglett [place], MD while in the presence of Marlet Korte [place], MD.   I, Keymarion Bearman [place], MD, have reviewed all documentation for this visit. The documentation on 11/20/23 for the exam, diagnosis, procedures, and orders are all accurate and complete.  Neveah Bang G. [place], MD  [facility] [place] office.

## 2023-11-21 LAB — TSH: TSH: 6.72 m[IU]/L — ABNORMAL HIGH

## 2023-11-24 LAB — URINE CYTOLOGY ANCILLARY ONLY
Chlamydia: NEGATIVE
Comment: NEGATIVE
Comment: NEGATIVE
Comment: NORMAL
Neisseria Gonorrhea: NEGATIVE
Trichomonas: NEGATIVE

## 2023-11-24 LAB — HIV ANTIBODY (ROUTINE TESTING W REFLEX)

## 2023-11-24 LAB — RPR: RPR Ser Ql: NONREACTIVE

## 2023-11-30 DIAGNOSIS — F53 Postpartum depression: Secondary | ICD-10-CM | POA: Diagnosis not present

## 2023-11-30 DIAGNOSIS — F411 Generalized anxiety disorder: Secondary | ICD-10-CM | POA: Diagnosis not present

## 2023-12-08 ENCOUNTER — Other Ambulatory Visit (HOSPITAL_BASED_OUTPATIENT_CLINIC_OR_DEPARTMENT_OTHER): Payer: Self-pay

## 2023-12-10 DIAGNOSIS — F53 Postpartum depression: Secondary | ICD-10-CM | POA: Diagnosis not present

## 2023-12-24 DIAGNOSIS — F4322 Adjustment disorder with anxiety: Secondary | ICD-10-CM | POA: Diagnosis not present

## 2023-12-24 DIAGNOSIS — F53 Postpartum depression: Secondary | ICD-10-CM | POA: Diagnosis not present

## 2023-12-28 DIAGNOSIS — F53 Postpartum depression: Secondary | ICD-10-CM | POA: Diagnosis not present

## 2023-12-28 DIAGNOSIS — F411 Generalized anxiety disorder: Secondary | ICD-10-CM | POA: Diagnosis not present

## 2023-12-28 DIAGNOSIS — F4322 Adjustment disorder with anxiety: Secondary | ICD-10-CM | POA: Diagnosis not present

## 2024-01-07 DIAGNOSIS — F53 Postpartum depression: Secondary | ICD-10-CM | POA: Diagnosis not present

## 2024-01-07 DIAGNOSIS — F411 Generalized anxiety disorder: Secondary | ICD-10-CM | POA: Diagnosis not present

## 2024-01-12 DIAGNOSIS — F411 Generalized anxiety disorder: Secondary | ICD-10-CM | POA: Diagnosis not present

## 2024-01-12 DIAGNOSIS — F53 Postpartum depression: Secondary | ICD-10-CM | POA: Diagnosis not present

## 2024-01-12 DIAGNOSIS — F4322 Adjustment disorder with anxiety: Secondary | ICD-10-CM | POA: Diagnosis not present

## 2024-01-21 DIAGNOSIS — F53 Postpartum depression: Secondary | ICD-10-CM | POA: Diagnosis not present

## 2024-02-04 ENCOUNTER — Telehealth: Payer: Self-pay | Admitting: *Deleted

## 2024-02-04 NOTE — Telephone Encounter (Signed)
 Copied from CRM [id number]. Topic: Referral - Question >> Feb 04, 2024  3:07 PM [place] J wrote: Reason for CRM: Patient wanted to follow up with a psychiatry referral she received back in February that was made to [facility]. The patient still wants to be seen for psychiatric care but at another different clinic. She does not have a preference to where she would like to go to, just as long as it is not [place].

## 2024-02-05 NOTE — Telephone Encounter (Signed)
Please send referral elsewhere.

## 2024-02-09 ENCOUNTER — Other Ambulatory Visit: Payer: Self-pay | Admitting: Family Medicine

## 2024-02-17 DIAGNOSIS — F53 Postpartum depression: Secondary | ICD-10-CM | POA: Diagnosis not present

## 2024-02-22 ENCOUNTER — Other Ambulatory Visit (HOSPITAL_COMMUNITY): Payer: Self-pay

## 2024-02-29 ENCOUNTER — Ambulatory Visit: Admitting: Obstetrics and Gynecology

## 2024-02-29 ENCOUNTER — Other Ambulatory Visit: Payer: Self-pay

## 2024-03-02 DIAGNOSIS — L700 Acne vulgaris: Secondary | ICD-10-CM | POA: Diagnosis not present

## 2024-03-03 ENCOUNTER — Ambulatory Visit: Admitting: Obstetrics and Gynecology

## 2024-03-17 ENCOUNTER — Ambulatory Visit: Admitting: Family Medicine

## 2024-03-17 ENCOUNTER — Other Ambulatory Visit (HOSPITAL_COMMUNITY)
Admission: RE | Admit: 2024-03-17 | Discharge: 2024-03-17 | Disposition: A | Discharge status: Home / Self Care | Source: Ambulatory Visit | Attending: Family Medicine | Admitting: Family Medicine

## 2024-03-17 VITALS — BP 116/67 | HR 69 | Ht 63.0 in | Wt 170.0 lb

## 2024-03-17 DIAGNOSIS — Z7721 Contact with and (suspected) exposure to potentially hazardous body fluids: Secondary | ICD-10-CM | POA: Diagnosis not present

## 2024-03-17 DIAGNOSIS — Z9889 Other specified postprocedural states: Secondary | ICD-10-CM

## 2024-03-17 LAB — CERVICOVAGINAL ANCILLARY ONLY
Bacterial Vaginitis (gardnerella): NEGATIVE
Candida Vaginitis: NEGATIVE
Chlamydia: NEGATIVE
Comment: NEGATIVE
Comment: NEGATIVE

## 2024-03-17 NOTE — Progress Notes (Signed)
   Subjective:    Patient ID: Jasmine Buchanan, female    DOB: [birth year], 28 y.o.   MRN: [id number]  HPI Patient seen for follow-up of IAB.  She had a medication IAB at the end of May.  She had bleeding for about 3 weeks, and has been resolved for the last week to 10 days.  She did have bleeding and saw tissue. No pelvic pain, nausea. Would like STI check. She did take a UPT a week ago and it was negative.   Review of Systems     Objective:   Physical Exam Vitals and nursing note reviewed.  Constitutional:      Appearance: Normal appearance.   Cardiovascular:     Rate and Rhythm: Normal rate and regular rhythm.  Pulmonary:     Effort: Pulmonary effort is normal.     Breath sounds: Normal breath sounds.  Abdominal:     General: Abdomen is flat. There is no distension.     Palpations: Abdomen is soft. There is no mass.     Tenderness: There is no abdominal tenderness. There is no guarding or rebound.   Skin:    Capillary Refill: Capillary refill takes less than 2 seconds.   Neurological:     General: No focal deficit present.     Mental Status: She is alert.   Psychiatric:        Mood and Affect: Mood normal.        Behavior: Behavior normal.        Thought Content: Thought content normal.        Judgment: Judgment normal.        Assessment & Plan:  1. Status post drug-induced abortion With negative UPT - appears completed. Discussed contraception - would like Ln IUD. Will get scheduled.  2. History of exposure to hazardous bodily fluids (Primary) - Cervicovaginal ancillary only - RPR+HBsAg+HCVAb+.SABRASABRA

## 2024-03-18 ENCOUNTER — Ambulatory Visit: Payer: Self-pay | Admitting: Family Medicine

## 2024-03-18 LAB — CERVICOVAGINAL ANCILLARY ONLY
Candida Glabrata: NEGATIVE
Comment: NEGATIVE
Comment: NEGATIVE
Comment: NORMAL
Comment: NEGATIVE
Neisseria Gonorrhea: NEGATIVE
Trichomonas: NEGATIVE

## 2024-03-18 LAB — RPR+HBSAG+HCVAB+...
HIV Screen 4th Generation wRfx: NONREACTIVE
Hep C Virus Ab: NONREACTIVE
Hepatitis B Surface Ag: NEGATIVE
RPR Ser Ql: NONREACTIVE

## 2024-03-22 ENCOUNTER — Ambulatory Visit: Admitting: Obstetrics and Gynecology

## 2024-03-22 NOTE — Progress Notes (Deleted)
   GYNECOLOGY CLINIC PROCEDURE NOTE  Jasmine Buchanan is a 28 y.o. [phone] here for *** IUD insertion. No GYN concerns.  Last pap smear was on *** and was normal.  IUD Insertion Procedure Note Patient identified, informed consent performed, consent signed.   Discussed risks of irregular bleeding, cramping, infection, malpositioning or misplacement of the IUD outside the uterus which may require further procedure such as laparoscopy. Time out was performed.  Urine pregnancy test negative.  Speculum placed in the vagina.  Cervix visualized.  Cleaned with Betadine x 2.  Grasped anteriorly with a single tooth tenaculum.  Uterus sounded to *** cm.  *** IUD placed per manufacturer's recommendations.  Strings trimmed to 3 cm. Tenaculum was removed, good hemostasis noted.  Patient tolerated procedure well.   Patient was given post-procedure instructions.  She was advised to have backup contraception for one week.  Patient was also asked to check IUD strings periodically and follow up in 4 weeks for IUD check.

## 2024-04-08 DIAGNOSIS — E0500 Thyrotoxicosis with diffuse goiter without thyrotoxic crisis or storm: Secondary | ICD-10-CM | POA: Diagnosis not present
